# Patient Record
Sex: Male | Born: 1980
Health system: Southern US, Community
[De-identification: ages and names within clinical notes are randomized; demographics above are authoritative.]

## PROBLEM LIST (undated history)

## (undated) DIAGNOSIS — F329 Major depressive disorder, single episode, unspecified: Secondary | ICD-10-CM

## (undated) DIAGNOSIS — T3111 Burns involving 10-19% of body surface with 10-19% third degree burns: Secondary | ICD-10-CM

## (undated) DIAGNOSIS — F32A Depression, unspecified: Secondary | ICD-10-CM

## (undated) DIAGNOSIS — F419 Anxiety disorder, unspecified: Secondary | ICD-10-CM

## (undated) DIAGNOSIS — I10 Essential (primary) hypertension: Secondary | ICD-10-CM

## (undated) HISTORY — PX: OTHER SURGICAL HISTORY: SHX169

---
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
## 2001-01-17 ENCOUNTER — Emergency Department (HOSPITAL_COMMUNITY): Admission: EM | Admit: 2001-01-17 | Discharge: 2001-01-18 | Payer: Self-pay | Admitting: *Deleted

## 2001-06-12 ENCOUNTER — Emergency Department (HOSPITAL_COMMUNITY): Admission: EM | Admit: 2001-06-12 | Discharge: 2001-06-12 | Payer: Self-pay | Admitting: Emergency Medicine

## 2003-03-31 ENCOUNTER — Emergency Department (HOSPITAL_COMMUNITY): Admission: EM | Admit: 2003-03-31 | Discharge: 2003-03-31 | Payer: Self-pay | Admitting: Emergency Medicine

## 2003-04-14 ENCOUNTER — Emergency Department (HOSPITAL_COMMUNITY): Admission: EM | Admit: 2003-04-14 | Discharge: 2003-04-14 | Payer: Self-pay | Admitting: *Deleted

## 2004-02-08 ENCOUNTER — Emergency Department (HOSPITAL_COMMUNITY): Admission: EM | Admit: 2004-02-08 | Discharge: 2004-02-08 | Payer: Self-pay | Admitting: *Deleted

## 2004-05-03 ENCOUNTER — Ambulatory Visit (HOSPITAL_COMMUNITY): Admission: RE | Admit: 2004-05-03 | Discharge: 2004-05-03 | Payer: Self-pay | Admitting: Internal Medicine

## 2004-06-15 ENCOUNTER — Emergency Department (HOSPITAL_COMMUNITY): Admission: EM | Admit: 2004-06-15 | Discharge: 2004-06-15 | Payer: Self-pay | Admitting: *Deleted

## 2004-09-29 ENCOUNTER — Emergency Department (HOSPITAL_COMMUNITY): Admission: EM | Admit: 2004-09-29 | Discharge: 2004-09-29 | Payer: Self-pay | Admitting: Emergency Medicine

## 2004-10-11 ENCOUNTER — Ambulatory Visit: Payer: Self-pay | Admitting: Orthopedic Surgery

## 2004-11-08 ENCOUNTER — Ambulatory Visit: Payer: Self-pay | Admitting: Orthopedic Surgery

## 2005-09-16 ENCOUNTER — Emergency Department (HOSPITAL_COMMUNITY): Admission: EM | Admit: 2005-09-16 | Discharge: 2005-09-16 | Payer: Self-pay | Admitting: Emergency Medicine

## 2006-08-16 ENCOUNTER — Emergency Department (HOSPITAL_COMMUNITY): Admission: EM | Admit: 2006-08-16 | Discharge: 2006-08-16 | Payer: Self-pay | Admitting: Emergency Medicine

## 2008-03-26 ENCOUNTER — Emergency Department (HOSPITAL_COMMUNITY): Admission: EM | Admit: 2008-03-26 | Discharge: 2008-03-26 | Payer: Self-pay | Admitting: Emergency Medicine

## 2009-06-02 ENCOUNTER — Emergency Department (HOSPITAL_COMMUNITY): Admission: EM | Admit: 2009-06-02 | Discharge: 2009-06-02 | Payer: Self-pay | Admitting: Emergency Medicine

## 2009-10-09 ENCOUNTER — Emergency Department (HOSPITAL_COMMUNITY): Admission: EM | Admit: 2009-10-09 | Discharge: 2009-10-09 | Payer: Self-pay | Admitting: Emergency Medicine

## 2009-10-15 ENCOUNTER — Encounter (INDEPENDENT_AMBULATORY_CARE_PROVIDER_SITE_OTHER): Payer: Self-pay | Admitting: *Deleted

## 2010-11-02 NOTE — Letter (Signed)
Summary: Appointment Reminder  [facility]  [address]   [place], [place] [address]   Phone: [phone]  Fax: [phone]       October 15, 2009   Kyle Mays [address] [place], [place]  [address] January 23, 1981    Dear Mr. KOTOWSKI,  We have been unable to reach you by phone to schedule a follow up   appointment that was recommended for you by Dr. Darrick Penna. It is very   important that we reach you to schedule an appointment. We hope that you  allow [place] to participate in your health care needs. Please contact us at  [phone] at your earliest convenience to schedule your appointment.  Sincerely,    [facility] R. Roetta Sessions, M.D.    Kassie Mends, M.D. Lorenza Burton, FNP-BC    Tana Coast, PA-C Phone: [phone]    Fax: [phone]

## 2010-12-19 LAB — BASIC METABOLIC PANEL
CO2: 30 mEq/L (ref 19–32)
Calcium: 9.4 mg/dL (ref 8.4–10.5)
Creatinine, Ser: 0.89 mg/dL (ref 0.4–1.5)
GFR calc non Af Amer: >60 mL/min (ref >60)
Glucose, Bld: 91 mg/dL (ref 70–99)
Potassium: 4.3 mEq/L (ref 3.5–5.1)

## 2010-12-19 LAB — DIFFERENTIAL
Basophils Absolute: 0 10*3/uL (ref 0.0–0.1)
Basophils Relative: 0 % (ref 0–1)
Lymphocytes Relative: 24 % (ref 12–46)
Lymphs Abs: 2.8 10*3/uL (ref 0.7–4.0)
Monocytes Absolute: 0.5 10*3/uL (ref 0.1–1.0)
Neutrophils Relative %: 69 % (ref 43–77)

## 2010-12-19 LAB — CBC
MCHC: 34.1 g/dL (ref 30.0–36.0)
RDW: 13.1 % (ref 11.5–15.5)
WBC: 11.3 10*3/uL — ABNORMAL HIGH (ref 4.0–10.5)

## 2011-05-11 ENCOUNTER — Emergency Department (HOSPITAL_COMMUNITY): Payer: BC Managed Care – PPO

## 2011-05-11 ENCOUNTER — Other Ambulatory Visit: Payer: Self-pay

## 2011-05-11 ENCOUNTER — Emergency Department (HOSPITAL_COMMUNITY)
Admission: EM | Admit: 2011-05-11 | Discharge: 2011-05-11 | Disposition: A | Discharge status: Home / Self Care | Payer: BC Managed Care – PPO | Attending: Emergency Medicine | Admitting: Emergency Medicine

## 2011-05-11 DIAGNOSIS — F17200 Nicotine dependence, unspecified, uncomplicated: Secondary | ICD-10-CM | POA: Insufficient documentation

## 2011-05-11 DIAGNOSIS — F341 Dysthymic disorder: Secondary | ICD-10-CM | POA: Insufficient documentation

## 2011-05-11 DIAGNOSIS — R42 Dizziness and giddiness: Secondary | ICD-10-CM | POA: Insufficient documentation

## 2011-05-11 DIAGNOSIS — E860 Dehydration: Secondary | ICD-10-CM | POA: Insufficient documentation

## 2011-05-11 HISTORY — DX: Major depressive disorder, single episode, unspecified: F32.9

## 2011-05-11 HISTORY — DX: Depression, unspecified: F32.A

## 2011-05-11 HISTORY — DX: Anxiety disorder, unspecified: F41.9

## 2011-05-11 LAB — CBC
HCT: 46 % (ref 39.0–52.0)
MCHC: 33.5 g/dL (ref 30.0–36.0)
WBC: 11.1 10*3/uL — ABNORMAL HIGH (ref 4.0–10.5)

## 2011-05-11 LAB — BASIC METABOLIC PANEL
CO2: 23 mEq/L (ref 19–32)
Calcium: 10.2 mg/dL (ref 8.4–10.5)
Chloride: 100 mEq/L (ref 96–112)
Creatinine, Ser: 0.9 mg/dL (ref 0.50–1.35)
GFR calc Af Amer: >60 mL/min (ref >60)
GFR calc non Af Amer: >60 mL/min (ref >60)
Potassium: 3.8 mEq/L (ref 3.5–5.1)

## 2011-05-11 LAB — URINALYSIS, ROUTINE W REFLEX MICROSCOPIC
Bilirubin Urine: NEGATIVE
Hgb urine dipstick: NEGATIVE
Leukocytes, UA: NEGATIVE
Nitrite: NEGATIVE

## 2011-05-11 LAB — DIFFERENTIAL
Basophils Relative: 0 % (ref 0–1)
Eosinophils Relative: 2 % (ref 0–5)
Neutrophils Relative %: 69 % (ref 43–77)

## 2011-05-11 MED ORDER — SODIUM CHLORIDE 0.9 % IV BOLUS (SEPSIS)
1000.0000 mL | Freq: Once | INTRAVENOUS | Status: AC
  Administered 2011-05-11: 1000 mL via INTRAVENOUS

## 2011-05-11 NOTE — ED Notes (Signed)
C/o dizziness onset 0830 this morning while working; c/o some SOB with dizziness, but denies chest pain or palpitations; describes feeling disoriented and as if he would pass out; states still feels "a little" dizzy, but feels better than this AM; was sent here after being seen at Urgent Care approx 1 hour ago; pt on cardiac monitor-NSR with rate of 80, no ectopy; a&ox4; in no distress; answers questions appropriately

## 2011-05-11 NOTE — ED Notes (Signed)
Pt states he is swimmy headed. SOB

## 2011-05-11 NOTE — ED Provider Notes (Signed)
History     CSN: [id number] Arrival date & time: 05/11/2011  2:24 PM  Chief Complaint  Patient presents with  . 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  The location was excessively warm with no breeze helping to cool him down.  He has had 3 bottles of water plus gatorade today, but has not urinated since early this am.  He skipped breakfast, but ate a regular lunch today.  Wife at bedside states he was out sick 2 days ago,  With generalized weakness nausea and reports of intermittent headaches over the past 5 days.  He reports occasional cough, nonproductive,  Noticing he sees white spots in his field of vision,  Only with forceful cough, at no other time.  He denies headache at present.  No chest pain,  No abdominal pain,  No nausea.  Feels thirsty.  The history is provided by the patient.    Past Medical History  Diagnosis Date  . Depression   . Anxiety     History reviewed. No pertinent past surgical history.  No family history on file.  History  Substance Use Topics  . Smoking status: Current Everyday Smoker    Types: Cigarettes  . Smokeless tobacco: Not on file  . Alcohol Use:       Review of Systems  Constitutional: Positive for fatigue. Negative for fever.  HENT: Negative for congestion, sore throat, neck pain and neck stiffness.   Eyes: Negative.   Respiratory: Positive for shortness of breath. Negative for cough, chest tightness, wheezing and stridor.   Cardiovascular: Negative for chest pain, palpitations and leg swelling.  Gastrointestinal: Negative for nausea and abdominal pain.  Genitourinary: Positive for decreased urine volume.  Musculoskeletal: Negative for myalgias, joint swelling and arthralgias.  Skin: Negative.  Negative for rash and wound.  Neurological: Positive for light-headedness. Negative for weakness, numbness and headaches.  Hematological: Negative.     Psychiatric/Behavioral: Negative.     Physical Exam  BP 125/82  Pulse 98  Temp(Src) 97.4 F (36.3 C) (Oral)  Resp 20  Ht 5\' 6"  (1.676 m)  Wt 250 lb (113.399 kg)  BMI 40.35 kg/m2  SpO2 97%  Physical Exam  Vitals reviewed. Constitutional: He is oriented to person, place, and time. He appears well-developed and well-nourished.  HENT:  Head: Normocephalic and atraumatic.  Mouth/Throat: Mucous membranes are dry.  Eyes: Conjunctivae are normal.  Neck: Normal range of motion.  Cardiovascular: Normal rate, regular rhythm, normal heart sounds and intact distal pulses.   Pulmonary/Chest: Effort normal and breath sounds normal. He has no wheezes.  Abdominal: Soft. Bowel sounds are normal. There is no tenderness.  Musculoskeletal: Normal range of motion.  Neurological: He is alert and oriented to person, place, and time.  Skin: Skin is warm and dry.  Psychiatric: He has a normal mood and affect.    ED Course  Procedures  Patient refused the cxr which was ordered.  He was given PO fluids and NS IV 1 liter bolus.  He is perc negative with no risk factors for PE.   MDM    Date: 05/11/2011  Rate: 82  Rhythm: normal sinus rhythm  QRS Axis: normal  Intervals: normal  ST/T Wave abnormalities: normal  Conduction Disutrbances:none  Narrative Interpretation:   Old EKG Reviewed: none available    Medical screening examination/treatment/procedure(s) were performed by non-physician practitioner and as supervising physician I was immediately available for consultation/collaboration.  Candis Musa, PA 05/11/11 1816  Jacklynn Dehaas K Aneesh Faller-Rasch, MD 05/11/11 2207

## 2011-05-11 NOTE — ED Notes (Signed)
Given ice water for po fluid challenge. A&ox4; in no distress.

## 2011-05-11 NOTE — ED Notes (Signed)
Pt left prior to receiving discharge instructions.

## 2012-02-13 ENCOUNTER — Encounter (HOSPITAL_COMMUNITY): Payer: Self-pay | Admitting: *Deleted

## 2012-02-13 ENCOUNTER — Emergency Department (HOSPITAL_COMMUNITY)
Admission: EM | Admit: 2012-02-13 | Discharge: 2012-02-13 | Disposition: A | Discharge status: Home / Self Care | Payer: BC Managed Care – PPO | Attending: Emergency Medicine | Admitting: Emergency Medicine

## 2012-02-13 DIAGNOSIS — IMO0002 Reserved for concepts with insufficient information to code with codable children: Secondary | ICD-10-CM | POA: Insufficient documentation

## 2012-02-13 DIAGNOSIS — F411 Generalized anxiety disorder: Secondary | ICD-10-CM | POA: Insufficient documentation

## 2012-02-13 DIAGNOSIS — X58XXXA Exposure to other specified factors, initial encounter: Secondary | ICD-10-CM | POA: Insufficient documentation

## 2012-02-13 DIAGNOSIS — F17200 Nicotine dependence, unspecified, uncomplicated: Secondary | ICD-10-CM | POA: Insufficient documentation

## 2012-02-13 DIAGNOSIS — F3289 Other specified depressive episodes: Secondary | ICD-10-CM | POA: Insufficient documentation

## 2012-02-13 DIAGNOSIS — F329 Major depressive disorder, single episode, unspecified: Secondary | ICD-10-CM | POA: Insufficient documentation

## 2012-02-13 DIAGNOSIS — T148XXA Other injury of unspecified body region, initial encounter: Secondary | ICD-10-CM

## 2012-02-13 NOTE — ED Notes (Signed)
Pt. Discharged to home, pt. Alert and oriented, ambulatory, gait steady, NAD noted 

## 2012-02-13 NOTE — ED Notes (Signed)
Blister between  Rt  great and 2nd toes.  Had itched initially, now has blister and pain

## 2012-02-13 NOTE — ED Notes (Signed)
Received pt. From Triage, pt. Alert and oriented, ambulated, NAD noted,

## 2012-02-13 NOTE — Discharge Instructions (Signed)
Blisters Blisters are fluid-filled sacs that form within the skin. Common causes of blistering are friction, burns, and exposure to irritating chemicals. The fluid in the blister protects the underlying damaged skin. Most of the time it is not recommended that you open blisters. When a blister is opened, there is an increased chance for infection. Usually, a blister will open on its own. They then dry up and peel off within 10 days. If the blister is tense and uncomfortable (painful) the fluid may be drained. If it is drained the roof of the blister should be left intact. The draining should only be done by a medical professional under aseptic conditions. Poorly fitting shoes and boots can cause blisters by being too tight or too loose. Wearing extra socks or using tape, bandages, or pads over the blister-prone area helps prevent the problem by reducing friction. Blisters heal more slowly if you have diabetes or if you have problems with your circulation. You need to be careful about medical follow-up to prevent infection. HOME CARE INSTRUCTIONS  Protect areas where blisters have formed until the skin is healed. Use a special bandage with a hole cut in the middle around the blister. This reduces pressure and friction. When the blister breaks, trim off the loose skin and keep the area clean by washing it with soap daily. Soaking the blister or broken-open blister with diluted vinegar twice daily for 15 minutes will dry it up and speed the healing. Use 3 tablespoons of white vinegar per quart of water (45 mL white vinegar per liter of water). An antibiotic ointment and a bandage can be used to cover the area after soaking.  SEEK MEDICAL CARE IF:   You develop increased redness, pain, swelling, or drainage in the blistered area.   You develop a pus-like discharge from the blistered area, chills, or a fever.  MAKE SURE YOU:   Understand these instructions.   Will watch your condition.   Will get help  right away if you are not doing well or get worse.  Document Released: 10/27/2004 Document Revised: 09/08/2011 Document Reviewed: 09/24/2008 Exit[employer] Patient Information 2012 [employer]. 

## 2012-02-13 NOTE — ED Provider Notes (Signed)
History     CSN: [id number]  Arrival date & time 02/13/12  Kyle Mays   First MD Initiated Contact with Patient 02/13/12 2109      Chief Complaint  Patient presents with  . Foot Pain     Patient is a 31 y.o. male presenting with lower extremity pain. The history is provided by the patient.  Foot Pain This is a new problem. The current episode started 2 days ago. The problem occurs hourly. The problem has not changed since onset.Pertinent negatives include no abdominal pain and no shortness of breath. The symptoms are aggravated by standing. The symptoms are relieved by rest. He has tried rest for the symptoms. The treatment provided no relief.   Pt reports blister between right great and second toe.  No trauma.  No injury.  He is ambulatory.  Reports mild itching  Past Medical History  Diagnosis Date  . Depression   . Anxiety     History reviewed. No pertinent past surgical history.  History reviewed. No pertinent family history.  History  Substance Use Topics  . Smoking status: Current Everyday Smoker    Types: Cigarettes  . Smokeless tobacco: Not on file  . Alcohol Use: No      Review of Systems  Respiratory: Negative for shortness of breath.   Gastrointestinal: Negative for abdominal pain.    Allergies  Review of patient's allergies indicates no known allergies.  Home Medications   Current Outpatient Rx  Name Route Sig Dispense Refill  . ALPRAZOLAM 0.5 MG PO TABS Oral Take 0.5 mg by mouth daily as needed. Anxiety     . CLOBETASOL PROPIONATE 0.05 % EX CREA Topical Apply 1 application topically 2 (two) times daily as needed. Dry skin     . DULOXETINE HCL 60 MG PO CPEP Oral Take 60 mg by mouth daily.        BP 144/100  Pulse 94  Temp(Src) 98 F (36.7 C) (Oral)  Resp 16  Ht 5\' 6"  (1.676 m)  Wt 250 lb (113.399 kg)  BMI 40.35 kg/m2  SpO2 98%  Physical Exam CONSTITUTIONAL: Well developed/well nourished HEAD AND FACE: Normocephalic/atraumatic EYES:  EOMI/PERRL ENMT: Mucous membranes moist NECK: supple no meningeal signs CV: S1/S2 noted, no murmurs/rubs/gallops noted LUNGS: Lungs are clear to auscultation bilaterally, no apparent distress ABDOMEN: soft, nontender, no rebound or guarding NEURO: Pt is awake/alert, moves all extremitiesx4 EXTREMITIES: pulses normal, full ROM Healed scar to medial aspect of right foot He has blister noted between great and second toe on right foot.  It is tender to palpation, no crepitance/streaking/eryhthema noted.  No edema noted to foot. No vesicles noted to foot SKIN: warm, color normal PSYCH: no abnormalities of mood noted ED Course  Procedures     1. Blister       MDM  Nursing notes reviewed and considered in documentation         Joya Gaskins, MD 02/13/12 2150

## 2012-04-24 ENCOUNTER — Emergency Department (HOSPITAL_COMMUNITY)
Admission: EM | Admit: 2012-04-24 | Discharge: 2012-04-24 | Disposition: A | Discharge status: Home / Self Care | Payer: BC Managed Care – PPO | Attending: Emergency Medicine | Admitting: Emergency Medicine

## 2012-04-24 ENCOUNTER — Encounter (HOSPITAL_COMMUNITY): Payer: Self-pay

## 2012-04-24 ENCOUNTER — Emergency Department (HOSPITAL_COMMUNITY): Payer: BC Managed Care – PPO

## 2012-04-24 DIAGNOSIS — F17200 Nicotine dependence, unspecified, uncomplicated: Secondary | ICD-10-CM | POA: Insufficient documentation

## 2012-04-24 DIAGNOSIS — Z79899 Other long term (current) drug therapy: Secondary | ICD-10-CM | POA: Insufficient documentation

## 2012-04-24 DIAGNOSIS — F3289 Other specified depressive episodes: Secondary | ICD-10-CM | POA: Insufficient documentation

## 2012-04-24 DIAGNOSIS — R079 Chest pain, unspecified: Secondary | ICD-10-CM | POA: Insufficient documentation

## 2012-04-24 DIAGNOSIS — F329 Major depressive disorder, single episode, unspecified: Secondary | ICD-10-CM | POA: Insufficient documentation

## 2012-04-24 DIAGNOSIS — F411 Generalized anxiety disorder: Secondary | ICD-10-CM | POA: Insufficient documentation

## 2012-04-24 DIAGNOSIS — R0789 Other chest pain: Secondary | ICD-10-CM

## 2012-04-24 LAB — CBC
MCH: 29.4 pg (ref 26.0–34.0)
Platelets: 171 10*3/uL (ref 150–400)
RBC: 5.37 MIL/uL (ref 4.22–5.81)
WBC: 11.8 10*3/uL — ABNORMAL HIGH (ref 4.0–10.5)

## 2012-04-24 LAB — BASIC METABOLIC PANEL
CO2: 25 mEq/L (ref 19–32)
Calcium: 9.3 mg/dL (ref 8.4–10.5)
Chloride: 103 mEq/L (ref 96–112)
Sodium: 138 mEq/L (ref 135–145)

## 2012-04-24 LAB — PROTIME-INR
INR: 0.98 (ref 0.00–1.49)
Prothrombin Time: 13.2 seconds (ref 11.6–15.2)

## 2012-04-24 LAB — POCT I-STAT TROPONIN I: Troponin i, poc: 0 ng/mL (ref 0.00–0.08)

## 2012-04-24 MED ORDER — ASPIRIN 81 MG PO CHEW
324.0000 mg | CHEWABLE_TABLET | Freq: Once | ORAL | Status: AC
  Administered 2012-04-24: 324 mg via ORAL
  Filled 2012-04-24: qty 4

## 2012-04-24 MED ORDER — OXYCODONE-ACETAMINOPHEN 5-325 MG PO TABS
1.0000 | ORAL_TABLET | Freq: Once | ORAL | Status: AC
  Administered 2012-04-24: 1 via ORAL
  Filled 2012-04-24: qty 1

## 2012-04-24 MED ORDER — NITROGLYCERIN 0.4 MG SL SUBL
0.4000 mg | SUBLINGUAL_TABLET | SUBLINGUAL | Status: DC | PRN
  Administered 2012-04-24 (×2): 0.4 mg via SUBLINGUAL
  Filled 2012-04-24: qty 75

## 2012-04-24 MED ORDER — CYCLOBENZAPRINE HCL 10 MG PO TABS: 10.0000 mg | ORAL_TABLET | Freq: Two times a day (BID) | ORAL | Status: AC | PRN

## 2012-04-24 MED ORDER — DIAZEPAM 5 MG PO TABS
10.0000 mg | ORAL_TABLET | Freq: Once | ORAL | Status: AC
  Administered 2012-04-24: 10 mg via ORAL
  Filled 2012-04-24: qty 2

## 2012-04-24 MED ORDER — IBUPROFEN 800 MG PO TABS: 800.0000 mg | ORAL_TABLET | Freq: Three times a day (TID) | ORAL | Status: AC

## 2012-04-24 MED ORDER — OXYCODONE-ACETAMINOPHEN 5-325 MG PO TABS: 1.0000 | ORAL_TABLET | ORAL | Status: AC | PRN
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
## 2012-04-24 NOTE — ED Provider Notes (Signed)
Patient CP improved until he begins activity. 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  Cardiac: Normal rate rhythm. Pulmonary: CTA. Abdomen: bowel sounds in all quadrants no tenderness. No pedal edema.  Serial Troponin: 1 Negative 2. Negative 3. Negative CXR: unremarkable. EKG: Normal  2:45 pm patient began to complain of left sided CP again 8/10, reproducible, without transmission, worse with movement. Repeat EKG performed.   Date: 04/24/2012  Rate: 63  Rhythm: normal sinus rhythm  QRS Axis: normal  Intervals: normal  ST/T Wave abnormalities: normal  Conduction Disutrbances:none  Narrative Interpretation: Normal ECG  Old EKG Reviewed: unchanged  This EKG reviewed with Dr. Lorenso Courier who believes the pain to be musculoskeletal in origin. Plans to discharge on Flexeril, Percocet, and Ibuprofen. Patient give return precautions verbally and in discharge summary.  Pixie Casino, PA-C 04/24/12 1504

## 2012-04-24 NOTE — ED Notes (Signed)
Pt was going into work this morning and began having chest pain, sts took acid reflux medicaiton an no relief.

## 2012-04-24 NOTE — ED Notes (Signed)
Pt reports sharp left sided chest pain on driving to work this AM at approx 0630. Pt reports pain was worse with deep inspiration and movement. Denies nausea, diaphoresis or radiation of pain.

## 2012-10-08 ENCOUNTER — Encounter (HOSPITAL_COMMUNITY): Payer: Self-pay | Admitting: Emergency Medicine

## 2012-10-08 ENCOUNTER — Emergency Department (HOSPITAL_COMMUNITY)
Admission: EM | Admit: 2012-10-08 | Discharge: 2012-10-08 | Disposition: A | Discharge status: Home / Self Care | Payer: BC Managed Care – PPO | Attending: Emergency Medicine | Admitting: Emergency Medicine

## 2012-10-08 ENCOUNTER — Emergency Department (HOSPITAL_COMMUNITY): Payer: BC Managed Care – PPO

## 2012-10-08 DIAGNOSIS — Z79899 Other long term (current) drug therapy: Secondary | ICD-10-CM | POA: Insufficient documentation

## 2012-10-08 DIAGNOSIS — F329 Major depressive disorder, single episode, unspecified: Secondary | ICD-10-CM | POA: Insufficient documentation

## 2012-10-08 DIAGNOSIS — N451 Epididymitis: Secondary | ICD-10-CM

## 2012-10-08 DIAGNOSIS — N453 Epididymo-orchitis: Secondary | ICD-10-CM | POA: Insufficient documentation

## 2012-10-08 DIAGNOSIS — F17200 Nicotine dependence, unspecified, uncomplicated: Secondary | ICD-10-CM | POA: Insufficient documentation

## 2012-10-08 DIAGNOSIS — R1110 Vomiting, unspecified: Secondary | ICD-10-CM | POA: Insufficient documentation

## 2012-10-08 DIAGNOSIS — F411 Generalized anxiety disorder: Secondary | ICD-10-CM | POA: Insufficient documentation

## 2012-10-08 DIAGNOSIS — F3289 Other specified depressive episodes: Secondary | ICD-10-CM | POA: Insufficient documentation

## 2012-10-08 LAB — URINALYSIS, ROUTINE W REFLEX MICROSCOPIC
Leukocytes, UA: NEGATIVE
Nitrite: NEGATIVE
Protein, ur: NEGATIVE mg/dL
Urobilinogen, UA: 0.2 mg/dL (ref 0.0–1.0)

## 2012-10-08 MED ORDER — LIDOCAINE HCL (PF) 1 % IJ SOLN
INTRAMUSCULAR | Status: AC
  Administered 2012-10-08: 2.1 mL
  Filled 2012-10-08: qty 5

## 2012-10-08 MED ORDER — OXYCODONE-ACETAMINOPHEN 5-325 MG PO TABS
2.0000 | ORAL_TABLET | Freq: Once | ORAL | Status: AC
  Administered 2012-10-08: 2 via ORAL
  Filled 2012-10-08: qty 2

## 2012-10-08 MED ORDER — DOXYCYCLINE HYCLATE 100 MG PO TABS
100.0000 mg | ORAL_TABLET | Freq: Once | ORAL | Status: AC
  Administered 2012-10-08: 100 mg via ORAL
  Filled 2012-10-08: qty 1

## 2012-10-08 MED ORDER — HYDROCODONE-ACETAMINOPHEN 5-325 MG PO TABS: 1.0000 | ORAL_TABLET | ORAL | Status: DC | PRN

## 2012-10-08 MED ORDER — CEFTRIAXONE SODIUM 1 G IJ SOLR
1.0000 g | Freq: Once | INTRAMUSCULAR | Status: AC
  Administered 2012-10-08: 1 g via INTRAMUSCULAR
  Filled 2012-10-08: qty 10

## 2012-10-08 MED ORDER — DOXYCYCLINE HYCLATE 100 MG PO CAPS: 100.0000 mg | ORAL_CAPSULE | Freq: Two times a day (BID) | ORAL | Status: DC

## 2012-10-08 NOTE — ED Provider Notes (Signed)
History     CSN: [id number]  Arrival date & time 10/08/12  2952   First MD Initiated Contact with Patient 10/08/12 0901      Chief Complaint  Patient presents with  . Testicle Pain    (Consider location/radiation/quality/duration/timing/severity/associated sxs/prior treatment) HPI Comments: 32 year old male presents emergency department complaining of sudden onset right-sided testicular pain since Saturday. Describes pain as sharp, non radiating, rated 10 out of 10. Nothing in particular makes the pain worse, it is present when he sits or stands. He tried taking ibuprofen with only mild relief. He is not sure whether or not the testicle is swollen because he cannot touch it. That he was getting "build up" so had intercourse with his wife in order to "empty out his sperm", however this made the pain worse. Denies penile pain or discharge. Denies dysuria, hematuria or any urinary symptoms. No fever or chills. The pain was so severe yesterday that he did vomit once.  The history is provided by the patient.    Past Medical History  Diagnosis Date  . Depression   . Anxiety     History reviewed. No pertinent past surgical history.  History reviewed. No pertinent family history.  History  Substance Use Topics  . Smoking status: Current Every Day Smoker    Types: Cigarettes  . Smokeless tobacco: Not on file  . Alcohol Use: No      Review of Systems  Constitutional: Negative for fever and chills.  HENT: Negative.   Respiratory: Negative for shortness of breath.   Cardiovascular: Negative for chest pain.  Gastrointestinal: Positive for vomiting.  Genitourinary: Positive for testicular pain. Negative for dysuria, hematuria, flank pain, discharge, difficulty urinating and penile pain.  Musculoskeletal: Negative for back pain.  Skin: Negative for color change.  Neurological: Negative for numbness.  Psychiatric/Behavioral: The patient is not nervous/anxious.     Allergies    Review of patient's allergies indicates no known allergies.  Home Medications   Current Outpatient Rx  Name  Route  Sig  Dispense  Refill  . ALPRAZOLAM 0.5 MG PO TABS   Oral   Take 0.5 mg by mouth at bedtime as needed. For sleep         . DULOXETINE HCL 60 MG PO CPEP   Oral   Take 60 mg by mouth daily.         . IBUPROFEN 200 MG PO TABS   Oral   Take 200-400 mg by mouth daily as needed. For pain           BP 146/79  Pulse 80  Temp 98.1 F (36.7 C) (Oral)  Resp 22  SpO2 96%  Physical Exam  Nursing note and vitals reviewed. Constitutional: He is oriented to person, place, and time. He appears well-developed. No distress.       Overweight, pacing.  HENT:  Head: Normocephalic and atraumatic.  Eyes: Conjunctivae normal are normal.  Neck: Normal range of motion. Neck supple.  Cardiovascular: Normal rate, regular rhythm and normal heart sounds.   Pulmonary/Chest: Effort normal and breath sounds normal.  Abdominal: Soft. Bowel sounds are normal. There is no tenderness.  Genitourinary: Penis normal. Right testis shows swelling and tenderness (to very light touch). Left testis shows no mass, no swelling and no tenderness. Circumcised. No penile tenderness. No discharge found.  Musculoskeletal: Normal range of motion. He exhibits no edema.  Neurological: He is alert and oriented to person, place, and time.  Skin: Skin is warm and dry.  No erythema.  Psychiatric: His behavior is normal. His mood appears anxious.    ED Course  Procedures (including critical care time)   Labs Reviewed  URINALYSIS, ROUTINE W REFLEX MICROSCOPIC   US Scrotum  10/08/2012  *RADIOLOGY REPORT*  Clinical Data:  Testicular pain.  SCROTAL ULTRASOUND DOPPLER ULTRASOUND OF THE TESTICLES  Technique: Complete ultrasound examination of the testicles, epididymis, and other scrotal structures was performed.  Color and spectral Doppler ultrasound were also utilized to evaluate blood flow to the testicles.   Comparison:  None  Findings:  Right testis:  3.9 x 2.2 x 3.1 cm.  Normal echotexture.  No focal abnormality.  Normal arterial and venous blood flow.  Left testis:  3.6 x 2.4 x 2.9 cm.  Normal echotexture.  No focal abnormality.  Normal arterial and venous blood flow.  Right epididymis:  Small cysts in the epididymal head, the largest 12 mm. Epididymal body and tail are prominent and heterogeneous with increased blood flow suggesting epididymitis.  Left epididymis:  2 mm cyst in the epididymal head.  Hydrocele:  Absent  Varicocele:  Absent  Pulsed Doppler interrogation of both testes demonstrates low resistance flow bilaterally.  IMPRESSION: Prominent right epididymal body and tail with increased vascularity compatible with epididymitis.  Bilateral epididymal head cysts.  Testes unremarkable.   Original Report Authenticated By: Charlett Nose, M.D.    [place] Art/ven Flow Abd Pelv Doppler  10/08/2012  *RADIOLOGY REPORT*  Clinical Data:  Testicular pain.  SCROTAL ULTRASOUND DOPPLER ULTRASOUND OF THE TESTICLES  Technique: Complete ultrasound examination of the testicles, epididymis, and other scrotal structures was performed.  Color and spectral Doppler ultrasound were also utilized to evaluate blood flow to the testicles.  Comparison:  None  Findings:  Right testis:  3.9 x 2.2 x 3.1 cm.  Normal echotexture.  No focal abnormality.  Normal arterial and venous blood flow.  Left testis:  3.6 x 2.4 x 2.9 cm.  Normal echotexture.  No focal abnormality.  Normal arterial and venous blood flow.  Right epididymis:  Small cysts in the epididymal head, the largest 12 mm. Epididymal body and tail are prominent and heterogeneous with increased blood flow suggesting epididymitis.  Left epididymis:  2 mm cyst in the epididymal head.  Hydrocele:  Absent  Varicocele:  Absent  Pulsed Doppler interrogation of both testes demonstrates low resistance flow bilaterally.  IMPRESSION: Prominent right epididymal body and tail with increased  vascularity compatible with epididymitis.  Bilateral epididymal head cysts.  Testes unremarkable.   Original Report Authenticated By: Charlett Nose, M.D.      1. Epididymitis       MDM  32 year old male with epididymitis. 1 g IM Rocephin given. Prescribed doxycycline for 7 days. No evidence of torsion on ultrasound. Vicodin #6 given for severe pain. Return precautions discussed. Patient states understanding of plan and is agreeable.        Trevor Mace, PA-C 10/08/12 1117

## 2012-10-08 NOTE — ED Notes (Signed)
Pt c/o right sided testicle pain x 3 days; pt sts hx of same and was not seen for

## 2012-10-08 NOTE — ED Provider Notes (Signed)
Medical screening examination/treatment/procedure(s) were performed by non-physician practitioner and as supervising physician I was immediately available for consultation/collaboration.   Flint Melter, MD 10/08/12 [phone]

## 2012-10-21 ENCOUNTER — Emergency Department (HOSPITAL_COMMUNITY)
Admission: EM | Admit: 2012-10-21 | Discharge: 2012-10-21 | Disposition: A | Discharge status: Home / Self Care | Payer: BC Managed Care – PPO | Attending: Emergency Medicine | Admitting: Emergency Medicine

## 2012-10-21 ENCOUNTER — Encounter (HOSPITAL_COMMUNITY): Payer: Self-pay | Admitting: *Deleted

## 2012-10-21 DIAGNOSIS — Z87828 Personal history of other (healed) physical injury and trauma: Secondary | ICD-10-CM | POA: Insufficient documentation

## 2012-10-21 DIAGNOSIS — M2540 Effusion, unspecified joint: Secondary | ICD-10-CM | POA: Insufficient documentation

## 2012-10-21 DIAGNOSIS — Z79899 Other long term (current) drug therapy: Secondary | ICD-10-CM | POA: Insufficient documentation

## 2012-10-21 DIAGNOSIS — M2550 Pain in unspecified joint: Secondary | ICD-10-CM | POA: Insufficient documentation

## 2012-10-21 DIAGNOSIS — F411 Generalized anxiety disorder: Secondary | ICD-10-CM | POA: Insufficient documentation

## 2012-10-21 DIAGNOSIS — F17200 Nicotine dependence, unspecified, uncomplicated: Secondary | ICD-10-CM | POA: Insufficient documentation

## 2012-10-21 DIAGNOSIS — F329 Major depressive disorder, single episode, unspecified: Secondary | ICD-10-CM | POA: Insufficient documentation

## 2012-10-21 DIAGNOSIS — F3289 Other specified depressive episodes: Secondary | ICD-10-CM | POA: Insufficient documentation

## 2012-10-21 DIAGNOSIS — G8929 Other chronic pain: Secondary | ICD-10-CM | POA: Insufficient documentation

## 2012-10-21 HISTORY — DX: Burns involving 10-19% of body surface with 10-19% third degree burns: T31.11

## 2012-10-21 MED ORDER — CYCLOBENZAPRINE HCL 10 MG PO TABS: 10.0000 mg | ORAL_TABLET | Freq: Three times a day (TID) | ORAL | Status: DC | PRN

## 2012-10-21 MED ORDER — HYDROMORPHONE HCL PF 1 MG/ML IJ SOLN
1.0000 mg | Freq: Once | INTRAMUSCULAR | Status: AC
  Administered 2012-10-21: 1 mg via INTRAMUSCULAR
  Filled 2012-10-21: qty 1

## 2012-10-21 MED ORDER — PREDNISONE 20 MG PO TABS: ORAL_TABLET | ORAL | Status: DC

## 2012-10-21 MED ORDER — OXYCODONE-ACETAMINOPHEN 5-325 MG PO TABS: 1.0000 | ORAL_TABLET | ORAL | Status: AC | PRN

## 2012-10-21 MED ORDER — METHOCARBAMOL 500 MG PO TABS
1000.0000 mg | ORAL_TABLET | Freq: Once | ORAL | Status: AC
  Administered 2012-10-21: 1000 mg via ORAL
  Filled 2012-10-21: qty 2

## 2012-10-21 NOTE — Discharge Instructions (Signed)
Arthralgia Your caregiver has diagnosed you as suffering from an arthralgia. Arthralgia means there is pain in a joint. This can come from many reasons including:  Bruising the joint which causes soreness (inflammation) in the joint.  Wear and tear on the joints which occur as we grow older (osteoarthritis).  Overusing the joint.  Various forms of arthritis.  Infections of the joint. Regardless of the cause of pain in your joint, most of these different pains respond to anti-inflammatory drugs and rest. The exception to this is when a joint is infected, and these cases are treated with antibiotics, if it is a bacterial infection. HOME CARE INSTRUCTIONS   Rest the injured area for as long as directed by your caregiver. Then slowly start using the joint as directed by your caregiver and as the pain allows. Crutches as directed may be useful if the ankles, knees or hips are involved. If the knee was splinted or casted, continue use and care as directed. If an stretchy or elastic wrapping bandage has been applied today, it should be removed and re-applied every 3 to 4 hours. It should not be applied tightly, but firmly enough to keep swelling down. Watch toes and feet for swelling, bluish discoloration, coldness, numbness or excessive pain. If any of these problems (symptoms) occur, remove the ace bandage and re-apply more loosely. If these symptoms persist, contact your caregiver or return to this location.  For the first 24 hours, keep the injured extremity elevated on pillows while lying down.  Apply ice for 15 to 20 minutes to the sore joint every couple hours while awake for the first half day. Then 3 to 4 times per day for the first 48 hours. Put the ice in a plastic bag and place a towel between the bag of ice and your skin.  Wear any splinting, casting, elastic bandage applications, or slings as instructed.  Only take over-the-counter or prescription medicines for pain, discomfort, or  fever as directed by your caregiver. Do not use aspirin immediately after the injury unless instructed by your physician. Aspirin can cause increased bleeding and bruising of the tissues.  If you were given crutches, continue to use them as instructed and do not resume weight bearing on the sore joint until instructed. Persistent pain and inability to use the sore joint as directed for more than 2 to 3 days are warning signs indicating that you should see a caregiver for a follow-up visit as soon as possible. Initially, a hairline fracture (break in bone) may not be evident on X-rays. Persistent pain and swelling indicate that further evaluation, non-weight bearing or use of the joint (use of crutches or slings as instructed), or further X-rays are indicated. X-rays may sometimes not show a small fracture until a week or 10 days later. Make a follow-up appointment with your own caregiver or one to whom we have referred you. A radiologist (specialist in reading X-rays) may read your X-rays. Make sure you know how you are to obtain your X-ray results. Do not assume everything is normal if you do not hear from us. SEEK MEDICAL CARE IF: Bruising, swelling, or pain increases. SEEK IMMEDIATE MEDICAL CARE IF:   Your fingers or toes are numb or blue.  The pain is not responding to medications and continues to stay the same or get worse.  The pain in your joint becomes severe.  You develop a fever over 102 F (38.9 C).  It becomes impossible to move or use the joint.   MAKE SURE YOU:   Understand these instructions.  Will watch your condition.  Will get help right away if you are not doing well or get worse. Document Released: 09/19/2005 Document Revised: 12/12/2011 Document Reviewed: 05/07/2008 [employer] Patient Information 2013 [employer].  

## 2012-10-21 NOTE — ED Notes (Signed)
Patient with no complaints at this time. Respirations even and unlabored. Skin warm/dry. Discharge instructions reviewed with patient at this time. Patient given opportunity to voice concerns/ask questions. Patient discharged at this time and left Emergency Department with steady gait.   
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
## 2012-10-24 NOTE — ED Provider Notes (Signed)
Medical screening examination/treatment/procedure(s) were performed by non-physician practitioner and as supervising physician I was immediately available for consultation/collaboration.   Joya Gaskins, MD 10/24/12 1755

## 2013-05-13 ENCOUNTER — Emergency Department: Payer: Self-pay | Admitting: Emergency Medicine

## 2013-05-13 LAB — COMPREHENSIVE METABOLIC PANEL
Alkaline Phosphatase: 73 U/L (ref 50–136)
Anion Gap: 5 — ABNORMAL LOW (ref 7–16)
BUN: 8 mg/dL (ref 7–18)
Bilirubin,Total: 0.3 mg/dL (ref 0.2–1.0)
Creatinine: 0.99 mg/dL (ref 0.60–1.30)
EGFR (African American): >60
SGOT(AST): 30 U/L (ref 15–37)
Sodium: 138 mmol/L (ref 136–145)
Total Protein: 8.1 g/dL (ref 6.4–8.2)

## 2013-05-13 LAB — CBC
HCT: 43 % (ref 40.0–52.0)
HGB: 14.9 g/dL (ref 13.0–18.0)
MCH: 29.1 pg (ref 26.0–34.0)
MCV: 84 fL (ref 80–100)
RBC: 5.14 10*6/uL (ref 4.40–5.90)
WBC: 10.3 10*3/uL (ref 3.8–10.6)

## 2013-05-13 LAB — CK TOTAL AND CKMB (NOT AT ARMC): CK, Total: 89 U/L (ref 35–232)

## 2013-05-13 LAB — TROPONIN I: Troponin-I: <0.02 ng/mL

## 2013-06-21 ENCOUNTER — Emergency Department (HOSPITAL_COMMUNITY): Payer: BC Managed Care – PPO

## 2013-06-21 ENCOUNTER — Encounter (HOSPITAL_COMMUNITY): Payer: Self-pay | Admitting: *Deleted

## 2013-06-21 ENCOUNTER — Emergency Department (HOSPITAL_COMMUNITY)
Admission: EM | Admit: 2013-06-21 | Discharge: 2013-06-22 | Disposition: A | Discharge status: Home / Self Care | Payer: BC Managed Care – PPO | Attending: Emergency Medicine | Admitting: Emergency Medicine

## 2013-06-21 DIAGNOSIS — Y9389 Activity, other specified: Secondary | ICD-10-CM | POA: Insufficient documentation

## 2013-06-21 DIAGNOSIS — S8010XA Contusion of unspecified lower leg, initial encounter: Secondary | ICD-10-CM | POA: Insufficient documentation

## 2013-06-21 DIAGNOSIS — Z87828 Personal history of other (healed) physical injury and trauma: Secondary | ICD-10-CM | POA: Insufficient documentation

## 2013-06-21 DIAGNOSIS — W108XXA Fall (on) (from) other stairs and steps, initial encounter: Secondary | ICD-10-CM | POA: Insufficient documentation

## 2013-06-21 DIAGNOSIS — F3289 Other specified depressive episodes: Secondary | ICD-10-CM | POA: Insufficient documentation

## 2013-06-21 DIAGNOSIS — Y9289 Other specified places as the place of occurrence of the external cause: Secondary | ICD-10-CM | POA: Insufficient documentation

## 2013-06-21 DIAGNOSIS — Z79899 Other long term (current) drug therapy: Secondary | ICD-10-CM | POA: Insufficient documentation

## 2013-06-21 DIAGNOSIS — F329 Major depressive disorder, single episode, unspecified: Secondary | ICD-10-CM | POA: Insufficient documentation

## 2013-06-21 DIAGNOSIS — F17200 Nicotine dependence, unspecified, uncomplicated: Secondary | ICD-10-CM | POA: Insufficient documentation

## 2013-06-21 DIAGNOSIS — F411 Generalized anxiety disorder: Secondary | ICD-10-CM | POA: Insufficient documentation

## 2013-06-21 DIAGNOSIS — S8011XA Contusion of right lower leg, initial encounter: Secondary | ICD-10-CM

## 2013-06-21 DIAGNOSIS — IMO0002 Reserved for concepts with insufficient information to code with codable children: Secondary | ICD-10-CM | POA: Insufficient documentation

## 2013-06-21 MED ORDER — OXYCODONE-ACETAMINOPHEN 5-325 MG PO TABS: 1.0000 | ORAL_TABLET | ORAL | Status: DC | PRN

## 2013-06-21 MED ORDER — OXYCODONE-ACETAMINOPHEN 5-325 MG PO TABS
2.0000 | ORAL_TABLET | Freq: Once | ORAL | Status: AC
  Administered 2013-06-22: 2 via ORAL
  Filled 2013-06-21: qty 2
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
## 2013-06-21 NOTE — ED Notes (Signed)
Pain rt leg after fall today , missed a step getting out of a bucket.  Large contusion to   Rt lower leg

## 2013-06-21 NOTE — ED Notes (Signed)
MD at bedside. 

## 2013-11-12 ENCOUNTER — Encounter (HOSPITAL_COMMUNITY): Payer: Self-pay | Admitting: Emergency Medicine

## 2013-11-12 ENCOUNTER — Emergency Department (HOSPITAL_COMMUNITY)
Admission: EM | Admit: 2013-11-12 | Discharge: 2013-11-12 | Disposition: A | Discharge status: Home / Self Care | Payer: BC Managed Care – PPO | Attending: Emergency Medicine | Admitting: Emergency Medicine

## 2013-11-12 DIAGNOSIS — Z23 Encounter for immunization: Secondary | ICD-10-CM | POA: Insufficient documentation

## 2013-11-12 DIAGNOSIS — L0390 Cellulitis, unspecified: Principal | ICD-10-CM

## 2013-11-12 DIAGNOSIS — F17200 Nicotine dependence, unspecified, uncomplicated: Secondary | ICD-10-CM | POA: Insufficient documentation

## 2013-11-12 DIAGNOSIS — L089 Local infection of the skin and subcutaneous tissue, unspecified: Secondary | ICD-10-CM

## 2013-11-12 DIAGNOSIS — L0291 Cutaneous abscess, unspecified: Secondary | ICD-10-CM | POA: Insufficient documentation

## 2013-11-12 DIAGNOSIS — Z8659 Personal history of other mental and behavioral disorders: Secondary | ICD-10-CM | POA: Insufficient documentation

## 2013-11-12 DIAGNOSIS — Z87828 Personal history of other (healed) physical injury and trauma: Secondary | ICD-10-CM | POA: Insufficient documentation

## 2013-11-12 MED ORDER — TETANUS-DIPHTH-ACELL PERTUSSIS 5-2.5-18.5 LF-MCG/0.5 IM SUSP
0.5000 mL | Freq: Once | INTRAMUSCULAR | Status: AC
  Administered 2013-11-12: 0.5 mL via INTRAMUSCULAR
  Filled 2013-11-12: qty 0.5

## 2013-11-12 MED ORDER — HYDROCODONE-ACETAMINOPHEN 5-325 MG PO TABS
2.0000 | ORAL_TABLET | Freq: Once | ORAL | Status: AC
  Administered 2013-11-12: 2 via ORAL
  Filled 2013-11-12: qty 2

## 2013-11-12 MED ORDER — HYDROCODONE-ACETAMINOPHEN 5-325 MG PO TABS: 2.0000 | ORAL_TABLET | ORAL | Status: DC | PRN

## 2013-11-12 MED ORDER — CEPHALEXIN 500 MG PO CAPS
500.0000 mg | ORAL_CAPSULE | Freq: Four times a day (QID) | ORAL | Status: DC
Start: 2013-11-12 — End: 2014-01-19

## 2013-11-12 NOTE — Discharge Instructions (Signed)
Cellulitis Cellulitis is an infection of the skin and the tissue beneath it. The infected area is usually red and tender. Cellulitis occurs most often in the arms and lower legs.  CAUSES  Cellulitis is caused by bacteria that enter the skin through cracks or cuts in the skin. The most common types of bacteria that cause cellulitis are Staphylococcus and Streptococcus. SYMPTOMS   Redness and warmth.  Swelling.  Tenderness or pain.  Fever. DIAGNOSIS  Your caregiver can usually determine what is wrong based on a physical exam. Blood tests may also be done. TREATMENT  Treatment usually involves taking an antibiotic medicine. HOME CARE INSTRUCTIONS   Take your antibiotics as directed. Finish them even if you start to feel better.  Keep the infected arm or leg elevated to reduce swelling.  Apply a warm cloth to the affected area up to 4 times per day to relieve pain.  Only take over-the-counter or prescription medicines for pain, discomfort, or fever as directed by your caregiver.  Keep all follow-up appointments as directed by your caregiver. SEEK MEDICAL CARE IF:   You notice red streaks coming from the infected area.  Your red area gets larger or turns dark in color.  Your bone or joint underneath the infected area becomes painful after the skin has healed.  Your infection returns in the same area or another area.  You notice a swollen bump in the infected area.  You develop new symptoms. SEEK IMMEDIATE MEDICAL CARE IF:   You have a fever.  You feel very sleepy.  You develop vomiting or diarrhea.  You have a general ill feeling (malaise) with muscle aches and pains. MAKE SURE YOU:   Understand these instructions.  Will watch your condition.  Will get help right away if you are not doing well or get worse. Document Released: 06/29/2005 Document Revised: 03/20/2012 Document Reviewed: 12/05/2011 [facility] Patient Information 2014 [place], [place].   Keep area  clean and dry  Cover wound while working Take antibiotic as prescribed Norco for pain as needed Return if symptoms worsen

## 2013-11-12 NOTE — ED Notes (Signed)
Patient reports fell on leg approximately 4 months ago. States leg has appeared to have bruising ever since. Reports pain started to shin about 4 days ago, and there seemed to be an infected hair on leg as well.
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
## 2013-11-14 NOTE — ED Provider Notes (Signed)
Medical screening examination/treatment/procedure(s) were performed by non-physician practitioner and as supervising physician I was immediately available for consultation/collaboration.  EKG Interpretation   None         Shelda JakesScott W. Kerline Trahan, MD 11/14/13 [id number]

## 2013-11-21 MED FILL — Hydrocodone-Acetaminophen Tab 5-325 MG: ORAL | Qty: 6 | Status: AC

## 2014-01-19 ENCOUNTER — Emergency Department (HOSPITAL_COMMUNITY): Payer: BC Managed Care – PPO

## 2014-01-19 ENCOUNTER — Encounter (HOSPITAL_COMMUNITY): Payer: Self-pay | Admitting: Emergency Medicine

## 2014-01-19 ENCOUNTER — Emergency Department (HOSPITAL_COMMUNITY)
Admission: EM | Admit: 2014-01-19 | Discharge: 2014-01-19 | Disposition: A | Discharge status: Home / Self Care | Payer: BC Managed Care – PPO | Attending: Emergency Medicine | Admitting: Emergency Medicine

## 2014-01-19 DIAGNOSIS — R000 Tachycardia, unspecified: Secondary | ICD-10-CM | POA: Insufficient documentation

## 2014-01-19 DIAGNOSIS — M25559 Pain in unspecified hip: Secondary | ICD-10-CM | POA: Insufficient documentation

## 2014-01-19 DIAGNOSIS — R638 Other symptoms and signs concerning food and fluid intake: Secondary | ICD-10-CM | POA: Insufficient documentation

## 2014-01-19 DIAGNOSIS — Z8659 Personal history of other mental and behavioral disorders: Secondary | ICD-10-CM | POA: Insufficient documentation

## 2014-01-19 DIAGNOSIS — J020 Streptococcal pharyngitis: Secondary | ICD-10-CM | POA: Insufficient documentation

## 2014-01-19 DIAGNOSIS — F17200 Nicotine dependence, unspecified, uncomplicated: Secondary | ICD-10-CM | POA: Insufficient documentation

## 2014-01-19 DIAGNOSIS — Z87828 Personal history of other (healed) physical injury and trauma: Secondary | ICD-10-CM | POA: Insufficient documentation

## 2014-01-19 DIAGNOSIS — Z792 Long term (current) use of antibiotics: Secondary | ICD-10-CM | POA: Insufficient documentation

## 2014-01-19 DIAGNOSIS — R0602 Shortness of breath: Secondary | ICD-10-CM | POA: Insufficient documentation

## 2014-01-19 DIAGNOSIS — H9209 Otalgia, unspecified ear: Secondary | ICD-10-CM | POA: Insufficient documentation

## 2014-01-19 LAB — RAPID STREP SCREEN (MED CTR MEBANE ONLY): Streptococcus, Group A Screen (Direct): POSITIVE — AB

## 2014-01-19 LAB — INFLUENZA PANEL BY PCR (TYPE A & B)
H1N1FLUPCR: NOT DETECTED
INFLAPCR: NEGATIVE
INFLBPCR: NEGATIVE

## 2014-01-19 MED ORDER — ACETAMINOPHEN 325 MG PO TABS
650.0000 mg | ORAL_TABLET | Freq: Once | ORAL | Status: AC
  Administered 2014-01-19: 650 mg via ORAL
  Filled 2014-01-19: qty 2

## 2014-01-19 MED ORDER — HYDROCOD POLST-CHLORPHEN POLST 10-8 MG/5ML PO LQCR
5.0000 mL | Freq: Once | ORAL | Status: AC
  Administered 2014-01-19: 5 mL via ORAL
  Filled 2014-01-19: qty 5

## 2014-01-19 MED ORDER — IBUPROFEN 800 MG PO TABS
800.0000 mg | ORAL_TABLET | Freq: Once | ORAL | Status: AC
  Administered 2014-01-19: 800 mg via ORAL
  Filled 2014-01-19: qty 1

## 2014-01-19 MED ORDER — PENICILLIN G BENZATHINE 1200000 UNIT/2ML IM SUSP
1.2000 10*6.[IU] | Freq: Once | INTRAMUSCULAR | Status: AC
  Administered 2014-01-19: 1.2 10*6.[IU] via INTRAMUSCULAR
  Filled 2014-01-19: qty 2
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
## 2014-01-19 NOTE — ED Notes (Signed)
Feeling bad since Friday.  Fever started yesterday,  C/o cough,  Hip pain and right ear , throat pain.

## 2014-01-19 NOTE — Discharge Instructions (Signed)
Strep Throat  Strep throat is an infection of the throat caused by a bacteria named Streptococcus pyogenes. Your caregiver may call the infection streptococcal "tonsillitis" or "pharyngitis" depending on whether there are signs of inflammation in the tonsils or back of the throat. Strep throat is most common in children aged 32 15 years during the cold months of the year, but it can occur in people of any age during any season. This infection is spread from person to person (contagious) through coughing, sneezing, or other close contact.  SYMPTOMS   · Fever or chills.  · Painful, swollen, red tonsils or throat.  · Pain or difficulty when swallowing.  · White or yellow spots on the tonsils or throat.  · Swollen, tender lymph nodes or "glands" of the neck or under the jaw.  · Red rash all over the body (rare).  DIAGNOSIS   Many different infections can cause the same symptoms. A test must be done to confirm the diagnosis so the right treatment can be given. A "rapid strep test" can help your caregiver make the diagnosis in a few minutes. If this test is not available, a light swab of the infected area can be used for a throat culture test. If a throat culture test is done, results are usually available in a day or two.  TREATMENT   Strep throat is treated with antibiotic medicine.  HOME CARE INSTRUCTIONS   · Gargle with 1 tsp of salt in 1 cup of warm water, 3 4 times per day or as needed for comfort.  · Family members who also have a sore throat or fever should be tested for strep throat and treated with antibiotics if they have the strep infection.  · Make sure everyone in your household washes their hands well.  · Do not share food, drinking cups, or personal items that could cause the infection to spread to others.  · You may need to eat a soft food diet until your sore throat gets better.  · Drink enough water and fluids to keep your urine clear or pale yellow. This will help prevent dehydration.  · Get plenty of  rest.  · Stay home from school, daycare, or work until you have been on antibiotics for 24 hours.  · Only take over-the-counter or prescription medicines for pain, discomfort, or fever as directed by your caregiver.  · If antibiotics are prescribed, take them as directed. Finish them even if you start to feel better.  SEEK MEDICAL CARE IF:   · The glands in your neck continue to enlarge.  · You develop a rash, cough, or earache.  · You cough up green, yellow-brown, or bloody sputum.  · You have pain or discomfort not controlled by medicines.  · Your problems seem to be getting worse rather than better.  SEEK IMMEDIATE MEDICAL CARE IF:   · You develop any new symptoms such as vomiting, severe headache, stiff or painful neck, chest pain, shortness of breath, or trouble swallowing.  · You develop severe throat pain, drooling, or changes in your voice.  · You develop swelling of the neck, or the skin on the neck becomes red and tender.  · You have a fever.  · You develop signs of dehydration, such as fatigue, dry mouth, and decreased urination.  · You become increasingly sleepy, or you cannot wake up completely.  Document Released: 09/16/2000 Document Revised: 09/05/2012 Document Reviewed: 11/18/2010  ExitCare® Patient Information ©2014 [employer].

## 2014-01-22 NOTE — ED Provider Notes (Signed)
Medical screening examination/treatment/procedure(s) were performed by non-physician practitioner and as supervising physician I was immediately available for consultation/collaboration.   EKG Interpretation None       Flint MelterElliott L Lason Eveland, MD 01/22/14 [phone]

## 2014-04-17 IMAGING — CR DG KNEE COMPLETE 4+V*R*
4 series · 4 of 4 positions shown · non-contrast
Comparison: None.

CLINICAL DATA: Pain post fall.

EXAM:
RIGHT KNEE - COMPLETE 4+ VIEW

[view not recorded (1 of 4)]
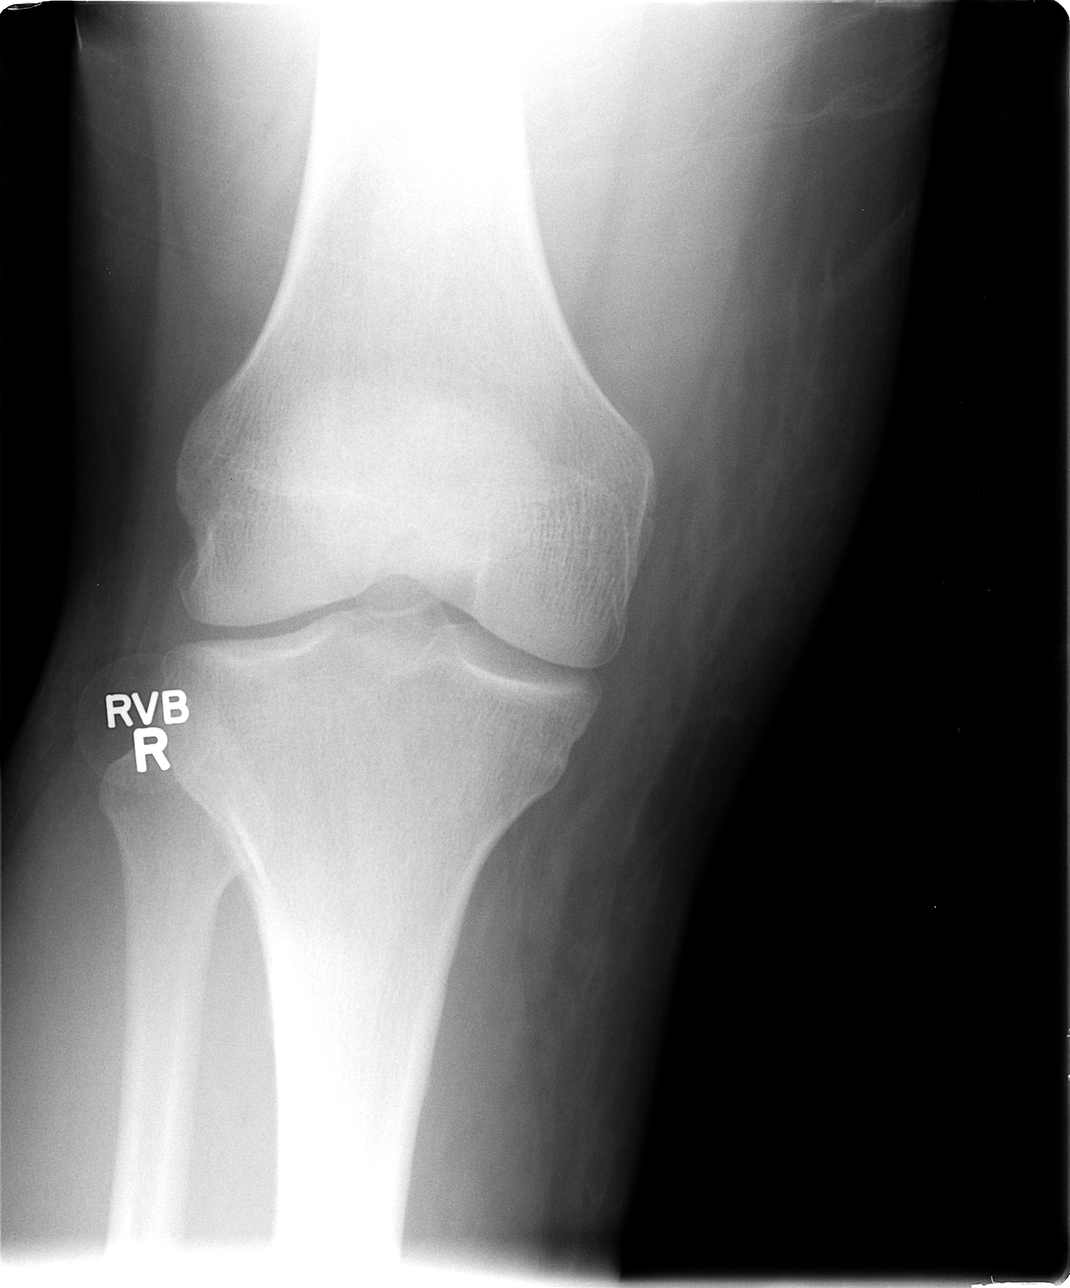

[view not recorded (2 of 4)]
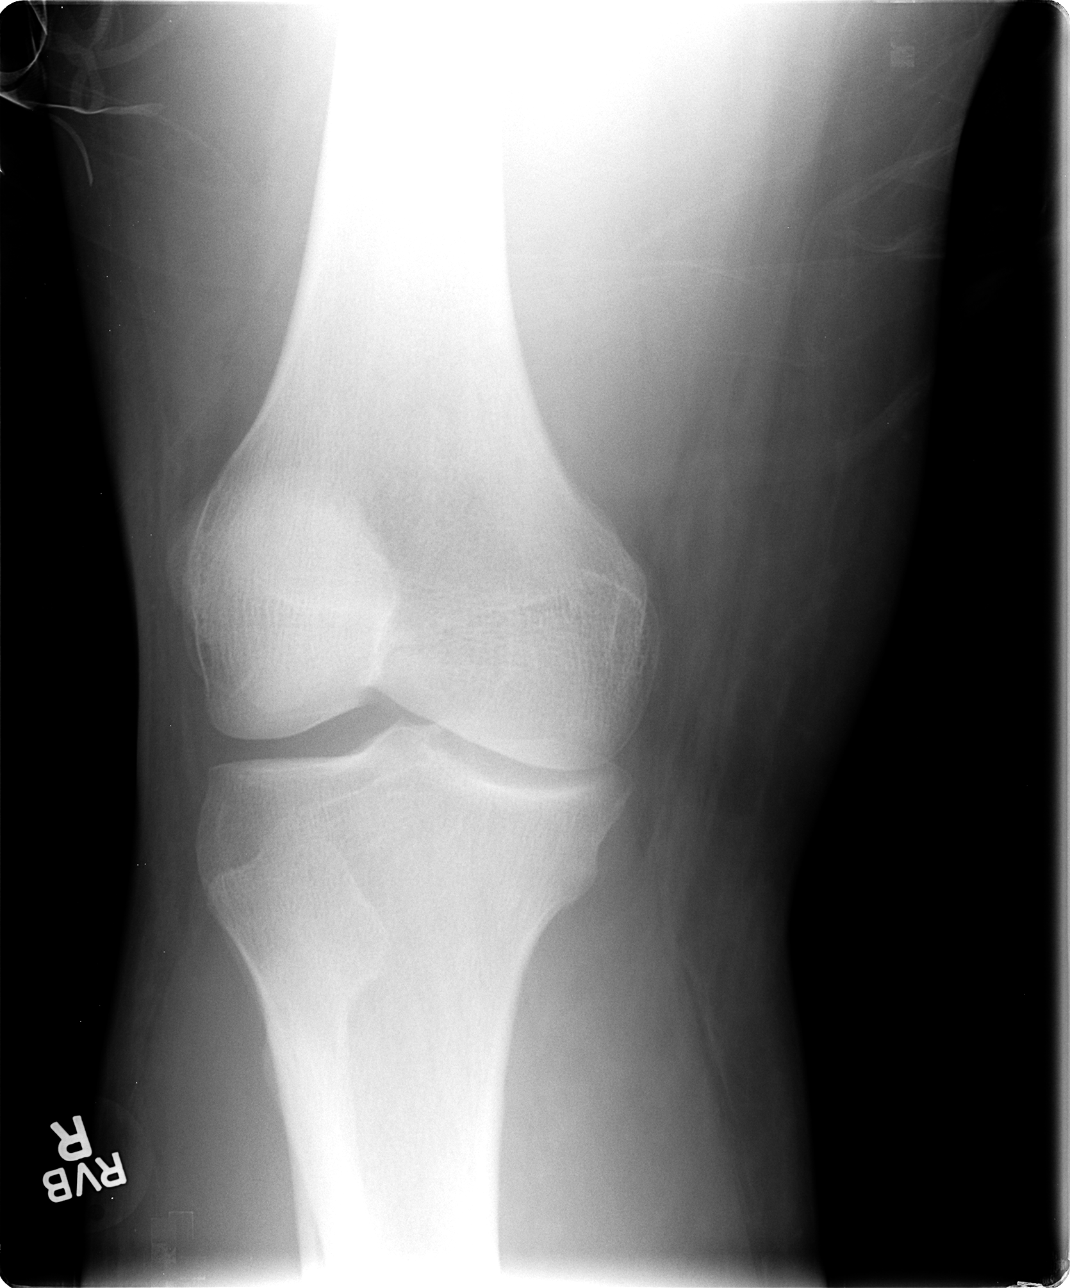

[view not recorded (3 of 4)]
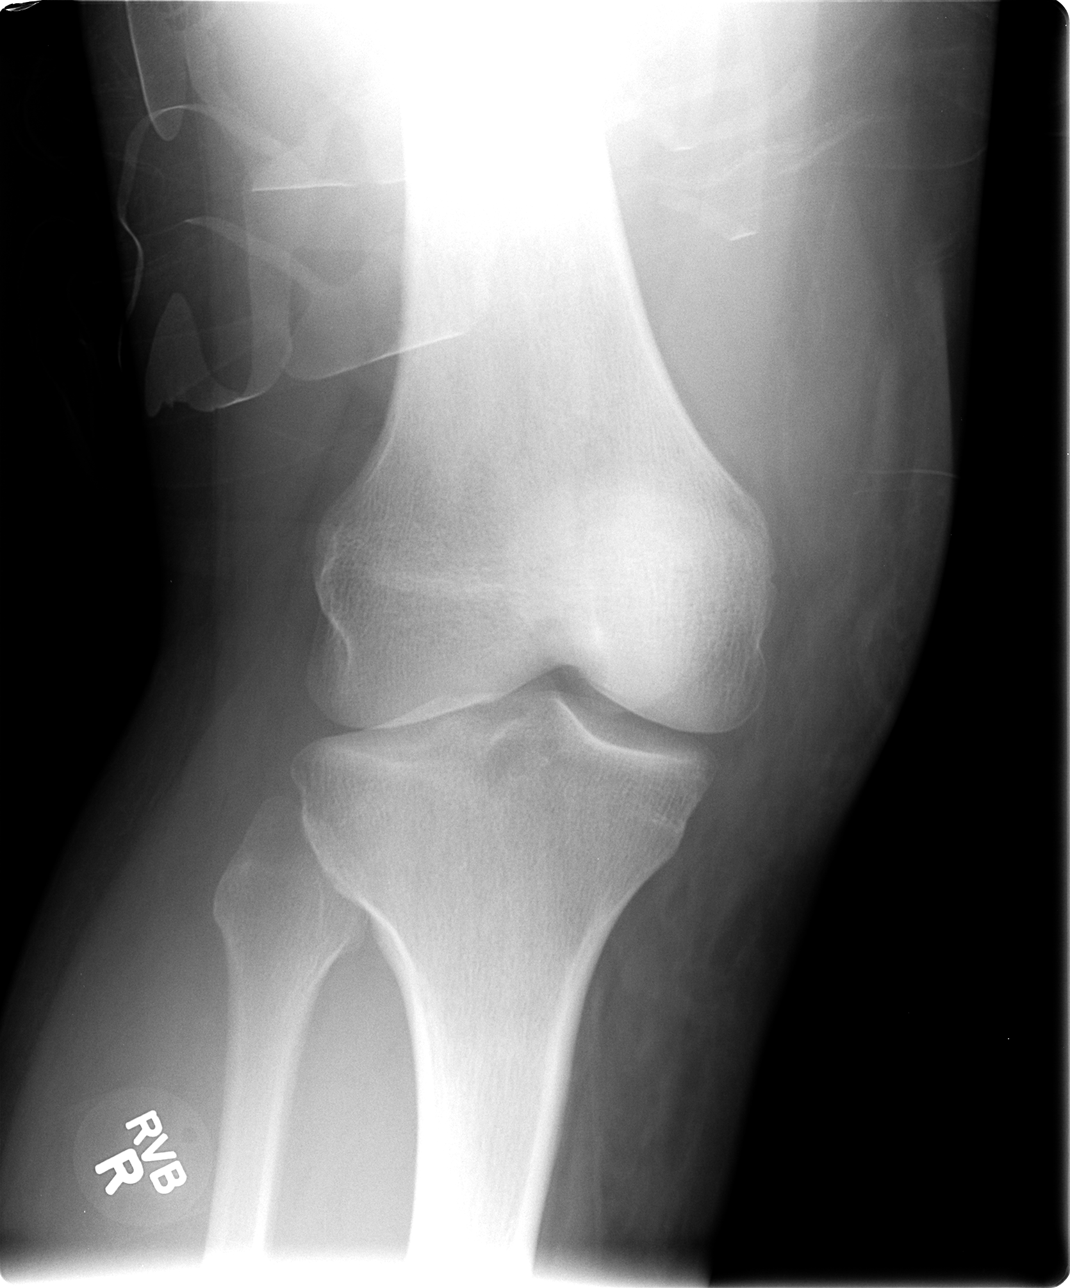

[view not recorded (4 of 4)]
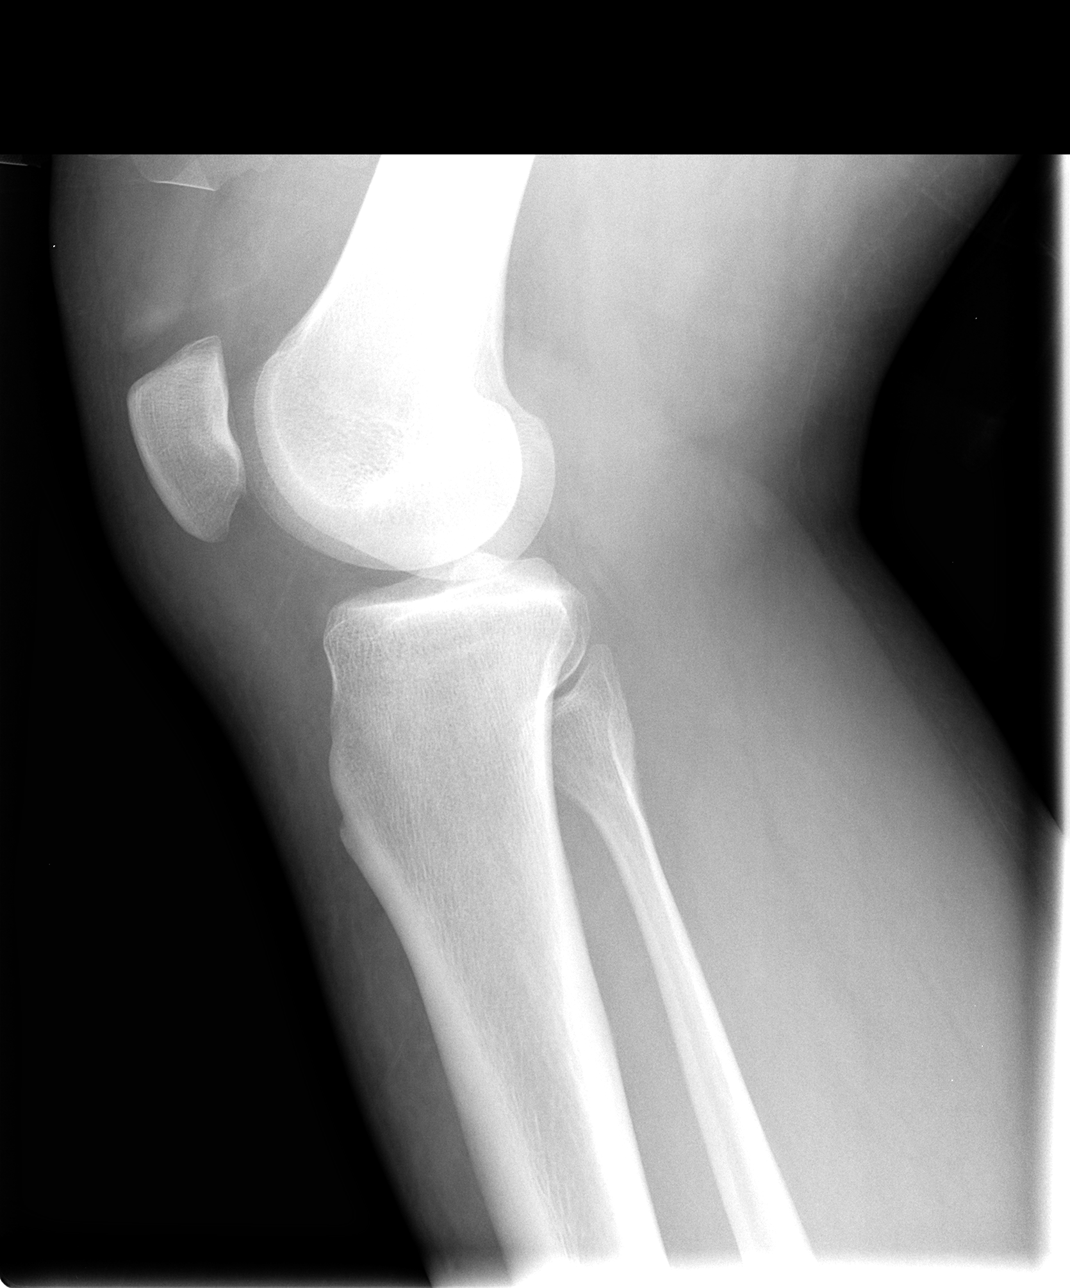

[4 of 4 positions shown; findings below may reference images not displayed]

FINDINGS: There is no evidence of fracture, dislocation, or joint effusion.
There is no evidence of arthropathy or other focal bone abnormality.
Soft tissues are unremarkable.
IMPRESSION: Negative.

## 2014-05-02 ENCOUNTER — Emergency Department (HOSPITAL_COMMUNITY)
Admission: EM | Admit: 2014-05-02 | Discharge: 2014-05-02 | Disposition: A | Discharge status: Home / Self Care | Payer: BC Managed Care – PPO | Attending: Emergency Medicine | Admitting: Emergency Medicine

## 2014-05-02 ENCOUNTER — Encounter (HOSPITAL_COMMUNITY): Payer: Self-pay | Admitting: Emergency Medicine

## 2014-05-02 DIAGNOSIS — F17200 Nicotine dependence, unspecified, uncomplicated: Secondary | ICD-10-CM | POA: Insufficient documentation

## 2014-05-02 DIAGNOSIS — Z8659 Personal history of other mental and behavioral disorders: Secondary | ICD-10-CM | POA: Insufficient documentation

## 2014-05-02 DIAGNOSIS — Z7982 Long term (current) use of aspirin: Secondary | ICD-10-CM | POA: Insufficient documentation

## 2014-05-02 DIAGNOSIS — M79609 Pain in unspecified limb: Secondary | ICD-10-CM | POA: Insufficient documentation

## 2014-05-02 DIAGNOSIS — L03119 Cellulitis of unspecified part of limb: Principal | ICD-10-CM

## 2014-05-02 DIAGNOSIS — Z9889 Other specified postprocedural states: Secondary | ICD-10-CM | POA: Insufficient documentation

## 2014-05-02 DIAGNOSIS — L02419 Cutaneous abscess of limb, unspecified: Secondary | ICD-10-CM | POA: Insufficient documentation

## 2014-05-02 DIAGNOSIS — L02415 Cutaneous abscess of right lower limb: Secondary | ICD-10-CM

## 2014-05-02 DIAGNOSIS — Z87828 Personal history of other (healed) physical injury and trauma: Secondary | ICD-10-CM | POA: Insufficient documentation

## 2014-05-02 DIAGNOSIS — Z791 Long term (current) use of non-steroidal anti-inflammatories (NSAID): Secondary | ICD-10-CM | POA: Insufficient documentation

## 2014-05-02 MED ORDER — CEPHALEXIN 500 MG PO CAPS: 500.0000 mg | ORAL_CAPSULE | Freq: Four times a day (QID) | ORAL | Status: DC

## 2014-05-02 MED ORDER — CEPHALEXIN 500 MG PO CAPS
500.0000 mg | ORAL_CAPSULE | Freq: Once | ORAL | Status: AC
  Administered 2014-05-02: 500 mg via ORAL
  Filled 2014-05-02: qty 1

## 2014-05-02 MED ORDER — HYDROCODONE-ACETAMINOPHEN 7.5-325 MG/15ML PO SOLN
10.0000 mL | Freq: Once | ORAL | Status: AC
  Administered 2014-05-02: 10 mL via ORAL
  Filled 2014-05-02: qty 15

## 2014-05-02 MED ORDER — BACITRACIN ZINC 500 UNIT/GM EX OINT
TOPICAL_OINTMENT | CUTANEOUS | Status: AC
  Administered 2014-05-02: 1
  Filled 2014-05-02: qty 0.9

## 2014-05-02 MED ORDER — SULFAMETHOXAZOLE-TMP DS 800-160 MG PO TABS
1.0000 | ORAL_TABLET | Freq: Once | ORAL | Status: AC
  Administered 2014-05-02: 1 via ORAL
  Filled 2014-05-02: qty 1

## 2014-05-02 MED ORDER — SULFAMETHOXAZOLE-TMP DS 800-160 MG PO TABS: 1.0000 | ORAL_TABLET | Freq: Two times a day (BID) | ORAL | Status: DC

## 2014-05-02 MED ORDER — HYDROCODONE-ACETAMINOPHEN 5-325 MG PO TABS: 1.0000 | ORAL_TABLET | Freq: Four times a day (QID) | ORAL | Status: DC | PRN

## 2014-05-02 MED ORDER — LIDOCAINE HCL (PF) 1 % IJ SOLN
2.0000 mL | Freq: Once | INTRAMUSCULAR | Status: AC
  Administered 2014-05-02: 2 mL via INTRADERMAL
  Filled 2014-05-02: qty 5

## 2014-05-02 NOTE — Discharge Instructions (Signed)
Abscess Elevate your leg above your heart as much as possible on 2 pillows. Take Tylenol or Advil for mild pain or the pain medicine prescribed for bad pain. Wash wound under the shower for 20 minutes at a time, 3 times daily for the next 3 days. Then place a thin layer of bacitracin ointment over the wound and cover with a bandage .Get your wound rechecked in 3 days An abscess (boil or furuncle) is an infected area on or under the skin. This area is filled with yellowish-white fluid (pus) and other material (debris). HOME CARE   Only take medicines as told by your doctor.  If you were given antibiotic medicine, take it as directed. Finish the medicine even if you start to feel better.  If gauze is used, follow your doctor's directions for changing the gauze.  To avoid spreading the infection:  Keep your abscess covered with a bandage.  Wash your hands well.  Do not share personal care items, towels, or whirlpools with others.  Avoid skin contact with others.  Keep your skin and clothes clean around the abscess.  Keep all doctor visits as told. GET HELP RIGHT AWAY IF:   You have more pain, puffiness (swelling), or redness in the wound site.  You have more fluid or blood coming from the wound site.  You have muscle aches, chills, or you feel sick.  You have a fever. MAKE SURE YOU:   Understand these instructions.  Will watch your condition.  Will get help right away if you are not doing well or get worse. Document Released: 03/07/2008 Document Revised: 03/20/2012 Document Reviewed: 12/02/2011 [facility] Patient Information 2015 [employer] [place]. This information is not intended to replace advice given to you by your health care provider. Make sure you discuss any questions you have with your health care provider.

## 2014-05-02 NOTE — ED Notes (Addendum)
PT c/o redness/swelling to area on RLE. Edema and redness noted. PT c/o sharp pain and burning.
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
## 2014-05-02 NOTE — ED Notes (Signed)
Swelling, redness and pain to right lower leg x 1 wk.

## 2014-05-05 ENCOUNTER — Encounter (HOSPITAL_COMMUNITY): Payer: Self-pay | Admitting: Emergency Medicine

## 2014-05-05 ENCOUNTER — Emergency Department (HOSPITAL_COMMUNITY)
Admission: EM | Admit: 2014-05-05 | Discharge: 2014-05-05 | Disposition: A | Discharge status: Home / Self Care | Payer: BC Managed Care – PPO | Attending: Emergency Medicine | Admitting: Emergency Medicine

## 2014-05-05 DIAGNOSIS — Z792 Long term (current) use of antibiotics: Secondary | ICD-10-CM | POA: Insufficient documentation

## 2014-05-05 DIAGNOSIS — Z791 Long term (current) use of non-steroidal anti-inflammatories (NSAID): Secondary | ICD-10-CM | POA: Insufficient documentation

## 2014-05-05 DIAGNOSIS — F17200 Nicotine dependence, unspecified, uncomplicated: Secondary | ICD-10-CM | POA: Insufficient documentation

## 2014-05-05 DIAGNOSIS — Z8659 Personal history of other mental and behavioral disorders: Secondary | ICD-10-CM | POA: Insufficient documentation

## 2014-05-05 DIAGNOSIS — Z4801 Encounter for change or removal of surgical wound dressing: Secondary | ICD-10-CM | POA: Insufficient documentation

## 2014-05-05 DIAGNOSIS — IMO0002 Reserved for concepts with insufficient information to code with codable children: Secondary | ICD-10-CM | POA: Insufficient documentation

## 2014-05-05 DIAGNOSIS — L02415 Cutaneous abscess of right lower limb: Secondary | ICD-10-CM

## 2014-05-05 DIAGNOSIS — Z79899 Other long term (current) drug therapy: Secondary | ICD-10-CM | POA: Insufficient documentation

## 2014-05-05 NOTE — Discharge Instructions (Signed)

## 2014-05-05 NOTE — ED Notes (Signed)
Pt has infected area to rt  Lower leg,  Taking antibiotic, here for recheck.
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
## 2014-05-05 NOTE — ED Notes (Signed)
Patient had I&D to right shine on Friday was told to return for recheck. Patient denies any fevers or increase in redness or swelling.

## 2014-05-08 NOTE — ED Provider Notes (Signed)
Medical screening examination/treatment/procedure(s) were performed by non-physician practitioner and as supervising physician I was immediately available for consultation/collaboration.   EKG Interpretation None        Lionel Woodberry F Vaibhav Fogleman, MD 05/08/14 1759 

## 2014-05-27 ENCOUNTER — Encounter (HOSPITAL_COMMUNITY): Payer: Self-pay | Admitting: Emergency Medicine

## 2014-05-27 ENCOUNTER — Emergency Department (HOSPITAL_COMMUNITY)
Admission: EM | Admit: 2014-05-27 | Discharge: 2014-05-27 | Discharge status: Against Medical Advice | Payer: BC Managed Care – PPO | Attending: Emergency Medicine | Admitting: Emergency Medicine

## 2014-05-27 ENCOUNTER — Emergency Department (HOSPITAL_COMMUNITY)
Admission: EM | Admit: 2014-05-27 | Discharge: 2014-05-27 | Disposition: A | Discharge status: Home / Self Care | Payer: BC Managed Care – PPO | Attending: Emergency Medicine | Admitting: Emergency Medicine

## 2014-05-27 DIAGNOSIS — Z8659 Personal history of other mental and behavioral disorders: Secondary | ICD-10-CM | POA: Insufficient documentation

## 2014-05-27 DIAGNOSIS — Z87828 Personal history of other (healed) physical injury and trauma: Secondary | ICD-10-CM | POA: Diagnosis not present

## 2014-05-27 DIAGNOSIS — Y939 Activity, unspecified: Secondary | ICD-10-CM | POA: Diagnosis not present

## 2014-05-27 DIAGNOSIS — T63451A Toxic effect of venom of hornets, accidental (unintentional), initial encounter: Secondary | ICD-10-CM

## 2014-05-27 DIAGNOSIS — IMO0002 Reserved for concepts with insufficient information to code with codable children: Secondary | ICD-10-CM | POA: Insufficient documentation

## 2014-05-27 DIAGNOSIS — T63461A Toxic effect of venom of wasps, accidental (unintentional), initial encounter: Secondary | ICD-10-CM | POA: Diagnosis not present

## 2014-05-27 DIAGNOSIS — T6391XA Toxic effect of contact with unspecified venomous animal, accidental (unintentional), initial encounter: Secondary | ICD-10-CM | POA: Insufficient documentation

## 2014-05-27 DIAGNOSIS — Y92009 Unspecified place in unspecified non-institutional (private) residence as the place of occurrence of the external cause: Secondary | ICD-10-CM | POA: Insufficient documentation

## 2014-05-27 DIAGNOSIS — F17200 Nicotine dependence, unspecified, uncomplicated: Secondary | ICD-10-CM | POA: Insufficient documentation

## 2014-05-27 DIAGNOSIS — Y929 Unspecified place or not applicable: Secondary | ICD-10-CM | POA: Diagnosis not present

## 2014-05-27 MED ORDER — DEXAMETHASONE SODIUM PHOSPHATE 4 MG/ML IJ SOLN
12.0000 mg | Freq: Once | INTRAMUSCULAR | Status: AC
  Administered 2014-05-27: 12 mg via INTRAMUSCULAR
  Filled 2014-05-27: qty 3

## 2014-05-27 MED ORDER — PREDNISONE 10 MG PO TABS: ORAL_TABLET | ORAL | Status: DC

## 2014-05-27 MED ORDER — HYDROCODONE-ACETAMINOPHEN 5-325 MG PO TABS
1.0000 | ORAL_TABLET | Freq: Once | ORAL | Status: AC
  Administered 2014-05-27: 1 via ORAL
  Filled 2014-05-27: qty 1

## 2014-05-27 MED ORDER — HYDROCODONE-ACETAMINOPHEN 5-325 MG PO TABS: 1.0000 | ORAL_TABLET | ORAL | Status: DC | PRN

## 2014-05-27 NOTE — ED Notes (Signed)
Pt reports "my itching has calmed down. I'm going to go home and if it gets worse I will come back". Instructed pt importance of being seen in the case of worsening allergic reactions and potential adverse results. Pt verbalized understanding. Pt left from triage without being seen.

## 2014-05-27 NOTE — ED Notes (Signed)
Pt states was stung by a hornet early this morning, sting to lt eye, swelling noted to lt side of face. Pt took  benadryl at 0345 and  ibuprofen. The pt states swelling has gotten worse. Denies SOB or oral swelling at this time.
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
## 2014-05-27 NOTE — ED Notes (Signed)
Pt reports being seen here this morning after being stunk by wasp. Reports being d/c with instructions to take benadryl at home. Pt reports around supper time breaking out in hives and itching with no relief from Benadryl.

## 2014-05-27 NOTE — ED Notes (Signed)
Ice pack applied to left eye.

## 2014-05-27 NOTE — ED Notes (Signed)
Pt reports some relief with pain meds.  Denies relief of swelling.  No obvious decrease in swelling noted.  nad noted.

## 2014-05-27 NOTE — ED Provider Notes (Signed)
Medical screening examination/treatment/procedure(s) were performed by non-physician practitioner and as supervising physician I was immediately available for consultation/collaboration.   EKG Interpretation None       Finlee Concepcion, MD 05/27/14 1503 

## 2014-05-27 NOTE — Discharge Instructions (Signed)

## 2014-11-12 ENCOUNTER — Emergency Department (HOSPITAL_COMMUNITY)
Admission: EM | Admit: 2014-11-12 | Discharge: 2014-11-12 | Disposition: A | Discharge status: Home / Self Care | Payer: BLUE CROSS/BLUE SHIELD | Attending: Emergency Medicine | Admitting: Emergency Medicine

## 2014-11-12 ENCOUNTER — Encounter (HOSPITAL_COMMUNITY): Payer: Self-pay | Admitting: Emergency Medicine

## 2014-11-12 DIAGNOSIS — Z720 Tobacco use: Secondary | ICD-10-CM | POA: Diagnosis not present

## 2014-11-12 DIAGNOSIS — F419 Anxiety disorder, unspecified: Secondary | ICD-10-CM | POA: Diagnosis not present

## 2014-11-12 DIAGNOSIS — R21 Rash and other nonspecific skin eruption: Secondary | ICD-10-CM | POA: Insufficient documentation

## 2014-11-12 MED ORDER — SULFAMETHOXAZOLE-TRIMETHOPRIM 800-160 MG PO TABS: 1.0000 | ORAL_TABLET | Freq: Two times a day (BID) | ORAL | Status: DC

## 2014-11-12 MED ORDER — PREDNISONE 50 MG PO TABS: 50.0000 mg | ORAL_TABLET | Freq: Every day | ORAL | Status: DC

## 2014-11-12 MED ORDER — HYDROXYZINE HCL 25 MG PO TABS: 25.0000 mg | ORAL_TABLET | Freq: Four times a day (QID) | ORAL | Status: DC

## 2014-11-12 NOTE — ED Notes (Signed)
PA at bedside.

## 2014-11-12 NOTE — Discharge Instructions (Signed)
Rash A rash is a change in the color or feel of your skin. There are many different types of rashes. You may have other problems along with your rash. HOME CARE  Avoid the thing that caused your rash.  Do not scratch your rash.  You may take cools baths to help stop itching.  Only take medicines as told by your doctor.  Keep all doctor visits as told. GET HELP RIGHT AWAY IF:   Your pain, puffiness (swelling), or redness gets worse.  You have a fever.  You have new or severe problems.  You have body aches, watery poop (diarrhea), or you throw up (vomit).  Your rash is not better after 3 days. MAKE SURE YOU:   Understand these instructions.  Will watch your condition.  Will get help right away if you are not doing well or get worse. Document Released: 03/07/2008 Document Revised: 12/12/2011 Document Reviewed: 07/04/2011 Exit[employer] Patient Information 2015 [employer]. This information is not intended to replace advice given to you by your health care provider. Make sure you discuss any questions you have with your health care provider.  

## 2014-11-12 NOTE — ED Notes (Signed)
Pt reports "rash for a year." pt reports seen for same in the past. Pt reports unable to make dermatologist appointment for follow-up. Dry scabs noted to BLE. nad noted.

## 2014-11-15 IMAGING — CR DG CHEST 2V
2 series · 2 of 2 positions shown · non-contrast
Comparison: Prior chest x-ray 04/24/2012

CLINICAL DATA: One day history of cough, fever and congestion

EXAM:
CHEST  2 VIEW

[view not recorded (1 of 2)]
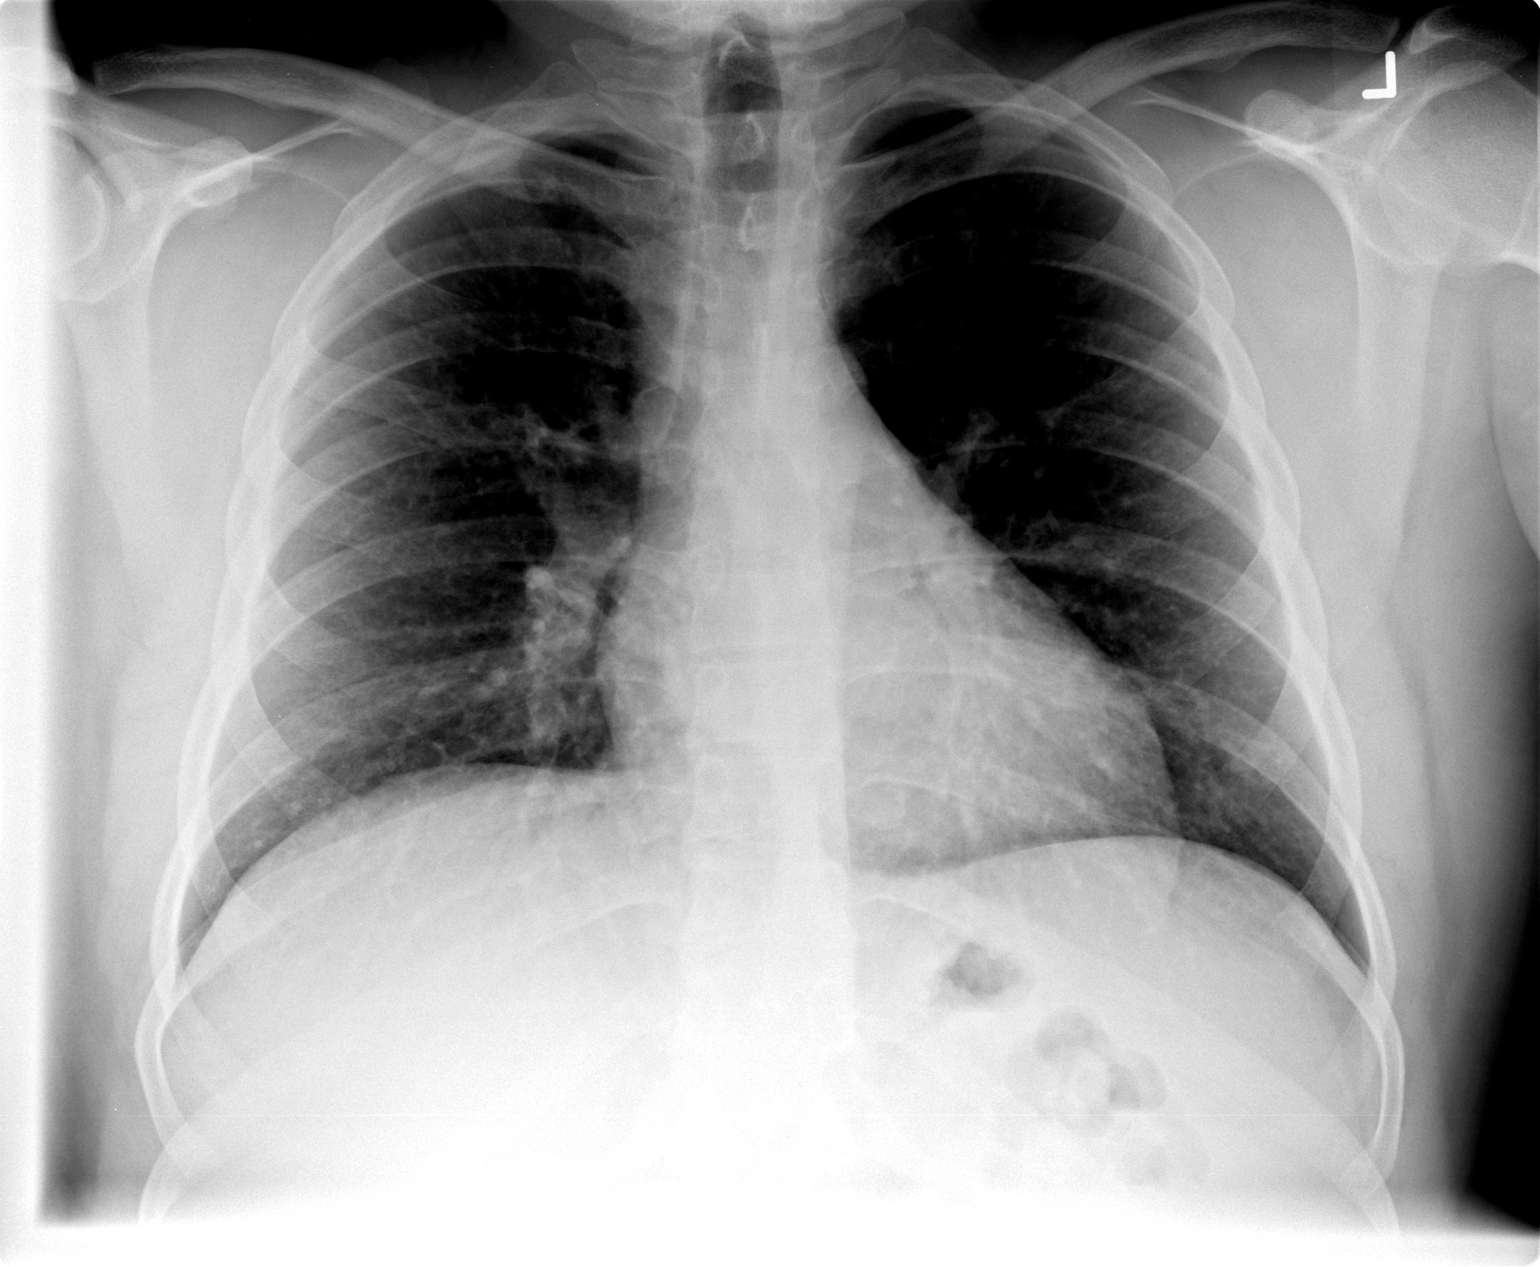

[view not recorded (2 of 2)]
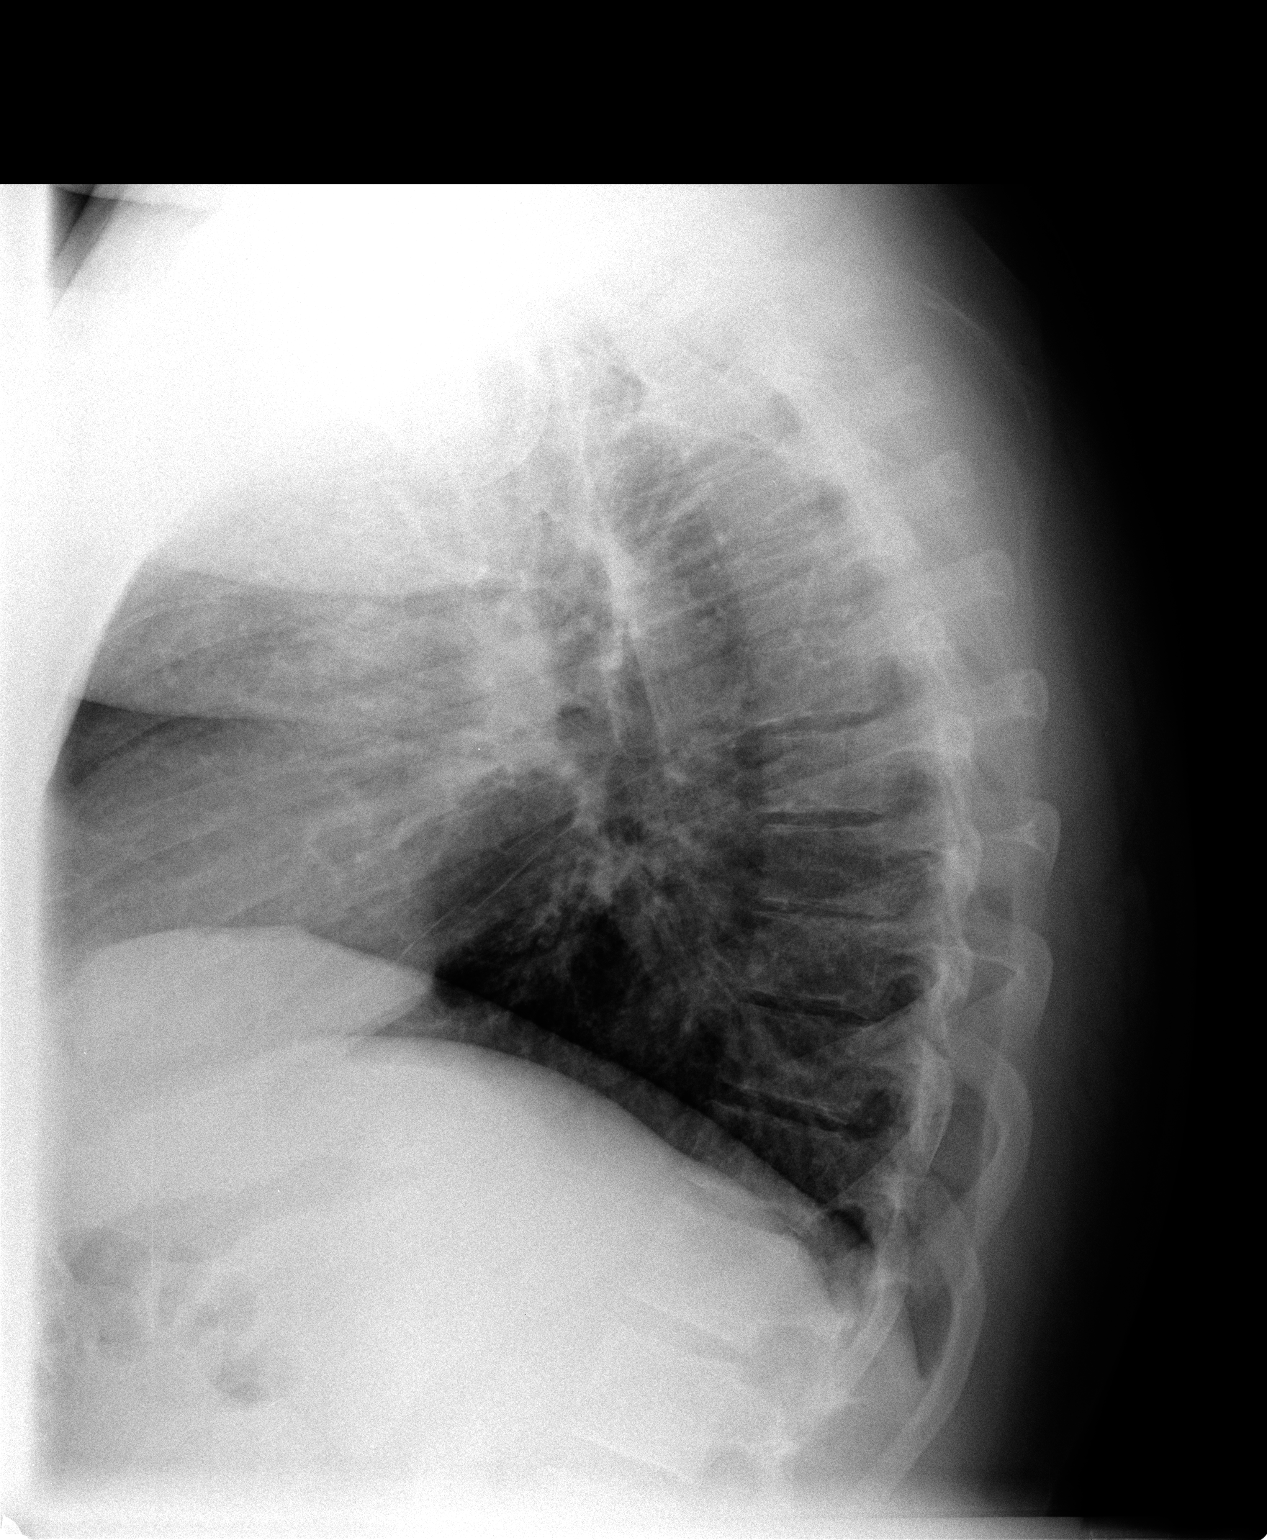

[2 of 2 positions shown; findings below may reference images not displayed]

FINDINGS: The lungs are clear and negative for focal airspace consolidation,
pulmonary edema or suspicious pulmonary nodule. No pleural effusion
or pneumothorax. Cardiac and mediastinal contours are within normal
limits. No acute fracture or lytic or blastic osseous lesions. The
visualized upper abdominal bowel gas pattern is unremarkable.
IMPRESSION: No active cardiopulmonary disease.
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
## 2015-05-05 ENCOUNTER — Encounter: Payer: Self-pay | Admitting: Emergency Medicine

## 2015-05-05 ENCOUNTER — Emergency Department
Admission: EM | Admit: 2015-05-05 | Discharge: 2015-05-05 | Disposition: A | Discharge status: Home / Self Care | Payer: BLUE CROSS/BLUE SHIELD | Attending: Emergency Medicine | Admitting: Emergency Medicine

## 2015-05-05 DIAGNOSIS — Z7952 Long term (current) use of systemic steroids: Secondary | ICD-10-CM | POA: Diagnosis not present

## 2015-05-05 DIAGNOSIS — Z7951 Long term (current) use of inhaled steroids: Secondary | ICD-10-CM | POA: Diagnosis not present

## 2015-05-05 DIAGNOSIS — Z792 Long term (current) use of antibiotics: Secondary | ICD-10-CM | POA: Diagnosis not present

## 2015-05-05 DIAGNOSIS — G44219 Episodic tension-type headache, not intractable: Secondary | ICD-10-CM | POA: Insufficient documentation

## 2015-05-05 DIAGNOSIS — Z720 Tobacco use: Secondary | ICD-10-CM | POA: Insufficient documentation

## 2015-05-05 DIAGNOSIS — R51 Headache: Secondary | ICD-10-CM | POA: Diagnosis present
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
## 2015-05-05 NOTE — ED Notes (Signed)
Patient presents to the ED with headache and fatigue for several days.  Patient states one day last week he got very hot and since then he has had a sharp intermittent headache.

## 2015-05-05 NOTE — ED Notes (Signed)
States he developed generalized headache on Friday .Marland Kitchen Pain eased off but has had intermittent sharp pain in head since ..with some dizziness and blurred vision. States he has been working outside in heat a lot. Neuro intact

## 2015-05-05 NOTE — Discharge Instructions (Signed)
Tension Headache °A tension headache is pain, pressure, or aching felt over the front and sides of the head. Tension headaches often come after stress, feeling worried (anxiety), or feeling sad or down for a while (depressed). °HOME CARE °· Only take medicine as told by your doctor. °· Lie down in a dark, quiet room when you have a headache. °· Keep a journal to find out if certain things bring on headaches. For example, write down: °¨ What you eat and drink. °¨ How much sleep you get. °¨ Any change to your diet or medicines. °· Relax by getting a massage or doing other relaxing activities. °· Put ice or heat packs on the head and neck area as told by your doctor. °· Lessen stress. °· Sit up straight. Do not tighten (tense) your muscles. °· Quit smoking if you smoke. °· Lessen how much alcohol you drink. °· Lessen how much caffeine you drink, or stop drinking caffeine. °· Eat and exercise regularly. °· Get enough sleep. °· Avoid using too much pain medicine. °GET HELP RIGHT AWAY IF:  °· Your headache becomes really bad. °· You have a fever. °· You have a stiff neck. °· You have trouble seeing. °· Your muscles are weak, or you lose muscle control. °· You lose your balance or have trouble walking. °· You feel like you will pass out (faint), or you pass out. °· You have really bad symptoms that are different than your first symptoms. °· You have problems with the medicines given to you by your doctor. °· Your medicines do not work. °· Your headache feels different than the other headaches. °· You feel sick to your stomach (nauseous) or throw up (vomit). °MAKE SURE YOU:  °· Understand these instructions. °· Will watch your condition. °· Will get help right away if you are not doing well or get worse. °Document Released: 12/14/2009 Document Revised: 12/12/2011 Document Reviewed: 09/09/2011 °[employer]® Patient Information ©2015 [employer]. This information is not intended to replace advice given to you by your health  care provider. Make sure you discuss any questions you have with your health care provider. ° °

## 2015-06-04 ENCOUNTER — Encounter: Payer: Self-pay | Admitting: *Deleted

## 2015-06-04 DIAGNOSIS — F17200 Nicotine dependence, unspecified, uncomplicated: Secondary | ICD-10-CM

## 2015-06-10 ENCOUNTER — Ambulatory Visit: Payer: BLUE CROSS/BLUE SHIELD | Admitting: Physician Assistant

## 2016-04-27 ENCOUNTER — Encounter: Payer: Self-pay | Admitting: Physician Assistant

## 2016-04-27 ENCOUNTER — Ambulatory Visit (INDEPENDENT_AMBULATORY_CARE_PROVIDER_SITE_OTHER): Payer: BLUE CROSS/BLUE SHIELD | Admitting: Physician Assistant

## 2016-04-27 VITALS — BP 126/88 | HR 80 | Temp 98.5°F | Resp 18 | Ht 66.5 in | Wt 275.0 lb

## 2016-04-27 DIAGNOSIS — E669 Obesity, unspecified: Secondary | ICD-10-CM

## 2016-04-27 DIAGNOSIS — Z23 Encounter for immunization: Secondary | ICD-10-CM | POA: Diagnosis not present

## 2016-04-27 DIAGNOSIS — G4700 Insomnia, unspecified: Secondary | ICD-10-CM | POA: Diagnosis not present

## 2016-04-27 DIAGNOSIS — F17200 Nicotine dependence, unspecified, uncomplicated: Secondary | ICD-10-CM

## 2016-04-27 DIAGNOSIS — F411 Generalized anxiety disorder: Secondary | ICD-10-CM

## 2016-04-27 DIAGNOSIS — Z720 Tobacco use: Secondary | ICD-10-CM

## 2016-04-27 DIAGNOSIS — Z0000 Encounter for general adult medical examination without abnormal findings: Secondary | ICD-10-CM | POA: Diagnosis not present

## 2016-04-27 LAB — LIPID PANEL
Cholesterol: 211 mg/dL — ABNORMAL HIGH (ref 125–200)
HDL: 38 mg/dL — AB (ref >=40)
LDL CALC: 134 mg/dL — AB (ref <130)
Total CHOL/HDL Ratio: 5.6 Ratio — ABNORMAL HIGH (ref <=5.0)
Triglycerides: 195 mg/dL — ABNORMAL HIGH (ref <150)
VLDL: 39 mg/dL — AB (ref <30)

## 2016-04-27 LAB — COMPLETE METABOLIC PANEL WITH GFR
ALBUMIN: 4.2 g/dL (ref 3.6–5.1)
ALT: 35 U/L (ref 9–46)
AST: 21 U/L (ref 10–40)
Alkaline Phosphatase: 66 U/L (ref 40–115)
BUN: 9 mg/dL (ref 7–25)
CHLORIDE: 104 mmol/L (ref 98–110)
CO2: 23 mmol/L (ref 20–31)
Calcium: 9.3 mg/dL (ref 8.6–10.3)
Creat: 0.8 mg/dL (ref 0.60–1.35)
GFR, Est African American: >89 mL/min (ref >=60)
Glucose, Bld: 79 mg/dL (ref 70–99)
POTASSIUM: 4.6 mmol/L (ref 3.5–5.3)
Sodium: 138 mmol/L (ref 135–146)
Total Bilirubin: 0.4 mg/dL (ref 0.2–1.2)
Total Protein: 7.2 g/dL (ref 6.1–8.1)

## 2016-04-27 LAB — CBC WITH DIFFERENTIAL/PLATELET
Basophils Absolute: 0 cells/uL (ref 0–200)
Basophils Relative: 0 %
Eosinophils Absolute: 200 cells/uL (ref 15–500)
Eosinophils Relative: 2 %
HCT: 45.6 % (ref 38.5–50.0)
HEMOGLOBIN: 15.2 g/dL (ref 13.0–17.0)
LYMPHS ABS: 2500 {cells}/uL (ref 850–3900)
Lymphocytes Relative: 25 %
MCH: 28.6 pg (ref 27.0–33.0)
MCHC: 33.3 g/dL (ref 32.0–36.0)
MCV: 85.9 fL (ref 80.0–100.0)
MONOS PCT: 5 %
MPV: 12.3 fL (ref 7.5–12.5)
Monocytes Absolute: 500 cells/uL (ref 200–950)
NEUTROS ABS: 6800 {cells}/uL (ref 1500–7800)
NEUTROS PCT: 68 %
PLATELETS: 198 10*3/uL (ref 140–400)
RBC: 5.31 MIL/uL (ref 4.20–5.80)
RDW: 14.5 % (ref 11.0–15.0)
WBC: 10 10*3/uL (ref 3.8–10.8)

## 2016-04-27 LAB — TSH: TSH: 2.53 m[IU]/L (ref 0.40–4.50)

## 2016-04-27 MED ORDER — BUPROPION HCL ER (SR) 150 MG PO TB12: ORAL_TABLET | ORAL | 1 refills | Status: DC

## 2016-04-27 MED ORDER — ALPRAZOLAM 0.5 MG PO TABS: 0.5000 mg | ORAL_TABLET | Freq: Every evening | ORAL | 0 refills | Status: DC | PRN
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
## 2016-04-28 LAB — VITAMIN D 25 HYDROXY (VIT D DEFICIENCY, FRACTURES): VIT D 25 HYDROXY: 20 ng/mL — AB (ref 30–100)

## 2016-05-02 ENCOUNTER — Encounter: Payer: Self-pay | Admitting: Family Medicine

## 2016-06-09 ENCOUNTER — Encounter: Payer: Self-pay | Admitting: Physician Assistant

## 2016-06-09 ENCOUNTER — Ambulatory Visit (INDEPENDENT_AMBULATORY_CARE_PROVIDER_SITE_OTHER): Payer: BLUE CROSS/BLUE SHIELD | Admitting: Physician Assistant

## 2016-06-09 ENCOUNTER — Telehealth: Payer: Self-pay

## 2016-06-09 VITALS — BP 150/86 | HR 85 | Temp 97.6°F | Resp 18 | Wt 283.0 lb

## 2016-06-09 DIAGNOSIS — F17200 Nicotine dependence, unspecified, uncomplicated: Secondary | ICD-10-CM

## 2016-06-09 DIAGNOSIS — Z720 Tobacco use: Secondary | ICD-10-CM | POA: Diagnosis not present

## 2016-06-09 DIAGNOSIS — F411 Generalized anxiety disorder: Secondary | ICD-10-CM

## 2016-06-09 DIAGNOSIS — E669 Obesity, unspecified: Secondary | ICD-10-CM

## 2016-06-09 DIAGNOSIS — G4700 Insomnia, unspecified: Secondary | ICD-10-CM

## 2016-06-09 MED ORDER — SERTRALINE HCL 50 MG PO TABS: 50.0000 mg | ORAL_TABLET | Freq: Every day | ORAL | 1 refills | Status: DC
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
## 2016-06-09 NOTE — Telephone Encounter (Signed)
Called pt regarding med that was not sent to pharmacy

## 2016-06-10 ENCOUNTER — Other Ambulatory Visit: Payer: Self-pay

## 2016-06-10 DIAGNOSIS — G4700 Insomnia, unspecified: Secondary | ICD-10-CM

## 2016-06-10 DIAGNOSIS — F411 Generalized anxiety disorder: Secondary | ICD-10-CM

## 2016-06-10 MED ORDER — ALPRAZOLAM 0.5 MG PO TABS: 0.5000 mg | ORAL_TABLET | Freq: Every evening | ORAL | 1 refills | Status: DC | PRN

## 2016-06-10 NOTE — Telephone Encounter (Signed)
RX called in .

## 2016-06-10 NOTE — Telephone Encounter (Signed)
Last OV 06-09-16  Last refill 04-27-16 Ok to refill?

## 2016-06-10 NOTE — Telephone Encounter (Signed)
Refill approved but change Sig to say---Take 1 -2 QHS PRN # [id number] + 1

## 2016-07-21 ENCOUNTER — Ambulatory Visit: Payer: BLUE CROSS/BLUE SHIELD | Admitting: Physician Assistant

## 2016-09-12 ENCOUNTER — Ambulatory Visit: Payer: BLUE CROSS/BLUE SHIELD | Admitting: Physician Assistant

## 2016-09-19 ENCOUNTER — Encounter: Payer: Self-pay | Admitting: Physician Assistant

## 2016-09-19 ENCOUNTER — Ambulatory Visit (INDEPENDENT_AMBULATORY_CARE_PROVIDER_SITE_OTHER): Payer: BLUE CROSS/BLUE SHIELD | Admitting: Physician Assistant

## 2016-09-19 VITALS — BP 160/110 | HR 86 | Temp 98.0°F | Resp 16 | Wt 284.0 lb

## 2016-09-19 DIAGNOSIS — I10 Essential (primary) hypertension: Secondary | ICD-10-CM | POA: Diagnosis not present

## 2016-09-19 DIAGNOSIS — G4700 Insomnia, unspecified: Secondary | ICD-10-CM | POA: Diagnosis not present

## 2016-09-19 DIAGNOSIS — F411 Generalized anxiety disorder: Secondary | ICD-10-CM

## 2016-09-19 DIAGNOSIS — L720 Epidermal cyst: Secondary | ICD-10-CM | POA: Diagnosis not present

## 2016-09-19 DIAGNOSIS — L309 Dermatitis, unspecified: Secondary | ICD-10-CM

## 2016-09-19 MED ORDER — PAROXETINE HCL 20 MG PO TABS: 20.0000 mg | ORAL_TABLET | Freq: Every day | ORAL | 1 refills | Status: DC

## 2016-09-19 MED ORDER — ALPRAZOLAM 1 MG PO TABS: 1.0000 mg | ORAL_TABLET | Freq: Every evening | ORAL | 2 refills | Status: DC | PRN

## 2016-09-19 MED ORDER — AMLODIPINE BESYLATE 5 MG PO TABS: 5.0000 mg | ORAL_TABLET | Freq: Every day | ORAL | 3 refills | Status: DC

## 2016-09-19 MED ORDER — TRIAMCINOLONE ACETONIDE 0.5 % EX CREA
1.0000 | TOPICAL_CREAM | Freq: Three times a day (TID) | CUTANEOUS | 0 refills | Status: DC
Start: 2016-09-19 — End: 2017-03-02
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
## 2016-10-04 ENCOUNTER — Encounter: Payer: Self-pay | Admitting: Physician Assistant

## 2016-10-31 ENCOUNTER — Ambulatory Visit: Payer: BLUE CROSS/BLUE SHIELD | Admitting: Physician Assistant

## 2017-01-09 ENCOUNTER — Ambulatory Visit (INDEPENDENT_AMBULATORY_CARE_PROVIDER_SITE_OTHER): Payer: BLUE CROSS/BLUE SHIELD | Admitting: Physician Assistant

## 2017-01-09 ENCOUNTER — Encounter: Payer: Self-pay | Admitting: Physician Assistant

## 2017-01-09 VITALS — BP 130/90 | HR 75 | Temp 97.8°F | Resp 18 | Wt 285.4 lb

## 2017-01-09 DIAGNOSIS — F17200 Nicotine dependence, unspecified, uncomplicated: Secondary | ICD-10-CM | POA: Diagnosis not present

## 2017-01-09 DIAGNOSIS — F411 Generalized anxiety disorder: Secondary | ICD-10-CM

## 2017-01-09 DIAGNOSIS — I10 Essential (primary) hypertension: Secondary | ICD-10-CM | POA: Diagnosis not present

## 2017-01-09 DIAGNOSIS — G4700 Insomnia, unspecified: Secondary | ICD-10-CM

## 2017-01-09 MED ORDER — ALPRAZOLAM 1 MG PO TABS: 1.0000 mg | ORAL_TABLET | Freq: Every evening | ORAL | 2 refills | Status: DC | PRN

## 2017-01-09 MED ORDER — AMLODIPINE BESYLATE 5 MG PO TABS: 5.0000 mg | ORAL_TABLET | Freq: Every day | ORAL | 3 refills | Status: DC

## 2017-01-09 MED ORDER — PAROXETINE HCL 20 MG PO TABS: 20.0000 mg | ORAL_TABLET | Freq: Every day | ORAL | 1 refills | Status: DC
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
## 2017-01-11 ENCOUNTER — Other Ambulatory Visit: Payer: Self-pay

## 2017-01-11 ENCOUNTER — Telehealth: Payer: Self-pay

## 2017-01-11 DIAGNOSIS — K219 Gastro-esophageal reflux disease without esophagitis: Secondary | ICD-10-CM | POA: Insufficient documentation

## 2017-01-11 MED ORDER — OMEPRAZOLE 20 MG PO CPDR: 20.0000 mg | DELAYED_RELEASE_CAPSULE | Freq: Every day | ORAL | 5 refills | Status: DC | PRN

## 2017-01-11 NOTE — Telephone Encounter (Signed)
RX sent to pharmacy patient is aware  

## 2017-01-11 NOTE — Telephone Encounter (Signed)
Patient was seen in office on 4-9 and states a Rx for heartburn was requested, but nothing was sent in  Patient is requesting something be sent in if possible.

## 2017-01-11 NOTE — Telephone Encounter (Signed)
Yes, he did request this and I forgot--- Add GERD to Problem List.  Omeprazole  1 po QD prn # 30 + 5

## 2017-01-18 ENCOUNTER — Encounter: Payer: Self-pay | Admitting: Family Medicine

## 2017-01-18 ENCOUNTER — Ambulatory Visit (INDEPENDENT_AMBULATORY_CARE_PROVIDER_SITE_OTHER): Payer: BLUE CROSS/BLUE SHIELD | Admitting: Family Medicine

## 2017-01-18 VITALS — BP 138/74 | HR 82 | Temp 98.5°F | Resp 18 | Ht 66.0 in | Wt 288.0 lb

## 2017-01-18 DIAGNOSIS — J029 Acute pharyngitis, unspecified: Secondary | ICD-10-CM

## 2017-01-18 DIAGNOSIS — Z20828 Contact with and (suspected) exposure to other viral communicable diseases: Secondary | ICD-10-CM

## 2017-01-18 DIAGNOSIS — J111 Influenza due to unidentified influenza virus with other respiratory manifestations: Secondary | ICD-10-CM | POA: Diagnosis not present

## 2017-01-18 LAB — STREP GROUP A AG, W/REFLEX TO CULT: STREGTOCOCCUS GROUP A AG SCREEN: NOT DETECTED

## 2017-01-18 MED ORDER — OSELTAMIVIR PHOSPHATE 75 MG PO CAPS: 75.0000 mg | ORAL_CAPSULE | Freq: Two times a day (BID) | ORAL | 0 refills | Status: DC

## 2017-01-18 MED ORDER — GUAIFENESIN-CODEINE 100-10 MG/5ML PO SOLN: 5.0000 mL | Freq: Four times a day (QID) | ORAL | 0 refills | Status: DC | PRN

## 2017-01-18 MED ORDER — FIRST-DUKES MOUTHWASH MT SUSP: OROMUCOSAL | 0 refills | Status: DC

## 2017-01-18 NOTE — Progress Notes (Signed)
   Subjective:    Patient ID: Kyle Mays, male    DOB: 1980/10/23, 36 y.o.   MRN: [id number]  Patient presents for Illness (x3 days- sore throat, productive cough with yellow to green mucus, nasal drainage, fatigue)   Sore throat for past few days, cough and congestion, + flu exposure in quarentine, body aches, fatigue   No aleve or tylneol or OTC medications    Review Of Systems:  GEN- + fatigue, fever, weight loss,weakness, recent illness HEENT- denies eye drainage, change in vision, nasal discharge, CVS- denies chest pain, palpitations RESP- denies SOB, +cough, wheeze ABD- denies N/V, change in stools, abd pain GU- denies dysuria, hematuria, dribbling, incontinence MSK- denies joint pain,+ muscle aches, injury Neuro- denies headache, dizziness, syncope, seizure activity       Objective:    BP 138/74   Pulse 82   Temp 98.5 F (36.9 C) (Oral)   Resp 18   Ht  (1.676 m)   Wt 288 lb (130.6 kg)   SpO2 99%   BMI 46.48 kg/m  GEN- NAD, alert and oriented x3,ill appearing  HEENT- PERRL, EOMI, non injected sclera, pink conjunctiva, MMM, oropharynx injected, no exudate, nares clear Neck- Supple, no + LAD  CVS- RRR, no murmur RESP-CTAB EXT- No edema Pulses- Radial 2+   Strep neg      Assessment & Plan:      Problem List Items Addressed This Visit    None    Visit Diagnoses    Influenza    -  Primary   Treat for Influenza B, tamiflu, robitussin codiene, rest fluids, dukes mouthwash for the sore throat    Relevant Medications   oseltamivir (TAMIFLU) 75 MG capsule   Diphenhyd-Hydrocort-Nystatin (FIRST-DUKES MOUTHWASH) SUSP   Pharyngitis, unspecified etiology       Relevant Orders   STREP GROUP A AG, W/REFLEX TO CULT (Completed)   Exposure to the flu          Note: This dictation was prepared with Dragon dictation along with smaller phrase technology. Any transcriptional errors that result from this process are unintentional.

## 2017-01-18 NOTE — Patient Instructions (Addendum)
Mouthwash  for your throat Take the flu medication Take the cough medicine F/U as needed    Influenza, Adult Influenza, more commonly known as "the flu," is a viral infection that primarily affects the respiratory tract. The respiratory tract includes organs that help you breathe, such as the lungs, nose, and throat. The flu causes many common cold symptoms, as well as a high fever and body aches. The flu spreads easily from person to person (is contagious). Getting a flu shot (influenza vaccination) every year is the best way to prevent influenza. What are the causes? Influenza is caused by a virus. You can catch the virus by:  Breathing in droplets from an infected person's cough or sneeze.  Touching something that was recently contaminated with the virus and then touching your mouth, nose, or eyes. What increases the risk? The following factors may make you more likely to get the flu:  Not cleaning your hands frequently with soap and water or alcohol-based hand sanitizer.  Having close contact with many people during cold and flu season.  Touching your mouth, eyes, or nose without washing or sanitizing your hands first.  Not drinking enough fluids or not eating a healthy diet.  Not getting enough sleep or exercise.  Being under a high amount of stress.  Not getting a yearly (annual) flu shot. You may be at a higher risk of complications from the flu, such as a severe lung infection (pneumonia), if you:  Are over the age of 9.  Are pregnant.  Have a weakened disease-fighting system (immune system). You may have a weakened immune system if you:  Have HIV or AIDS.  Are undergoing chemotherapy.  Aretaking medicines that reduce the activity of (suppress) the immune system.  Have a long-term (chronic) illness, such as heart disease, kidney disease, diabetes, or lung disease.  Have a liver disorder.  Are obese.  Have anemia. What are the signs or symptoms? Symptoms  of this condition typically last 4-10 days and may include:  Fever.  Chills.  Headache, body aches, or muscle aches.  Sore throat.  Cough.  Runny or congested nose.  Chest discomfort and cough.  Poor appetite.  Weakness or tiredness (fatigue).  Dizziness.  Nausea or vomiting. How is this diagnosed? This condition may be diagnosed based on your medical history and a physical exam. Your health care provider may do a nose or throat swab test to confirm the diagnosis. How is this treated? If influenza is detected early, you can be treated with antiviral medicine that can reduce the length of your illness and the severity of your symptoms. This medicine may be given by mouth (orally) or through an IV tube that is inserted in one of your veins. The goal of treatment is to relieve symptoms by taking care of yourself at home. This may include taking over-the-counter medicines, drinking plenty of fluids, and adding humidity to the air in your home. In some cases, influenza goes away on its own. Severe influenza or complications from influenza may be treated in a hospital. Follow these instructions at home:  Take over-the-counter and prescription medicines only as told by your health care provider.  Use a cool mist humidifier to add humidity to the air in your home. This can make breathing easier.  Rest as needed.  Drink enough fluid to keep your urine clear or pale yellow.  Cover your mouth and nose when you cough or sneeze.  Wash your hands with soap and water often, especially  after you cough or sneeze. If soap and water are not available, use hand sanitizer.  Stay home from work or school as told by your health care provider. Unless you are visiting your health care provider, try to avoid leaving home until your fever has been gone for 24 hours without the use of medicine.  Keep all follow-up visits as told by your health care provider. This is important. How is this  prevented?  Getting an annual flu shot is the best way to avoid getting the flu. You may get the flu shot in late summer, fall, or winter. Ask your health care provider when you should get your flu shot.  Wash your hands often or use hand sanitizer often.  Avoid contact with people who are sick during cold and flu season.  Eat a healthy diet, drink plenty of fluids, get enough sleep, and exercise regularly. Contact a health care provider if:  You develop new symptoms.  You have:  Chest pain.  Diarrhea.  A fever.  Your cough gets worse.  You produce more mucus.  You feel nauseous or you vomit. Get help right away if:  You develop shortness of breath or difficulty breathing.  Your skin or nails turn a bluish color.  You have severe pain or stiffness in your neck.  You develop a sudden headache or sudden pain in your face or ear.  You cannot stop vomiting. This information is not intended to replace advice given to you by your health care provider. Make sure you discuss any questions you have with your health care provider. Document Released: 09/16/2000 Document Revised: 02/25/2016 Document Reviewed: 07/14/2015 Elsevier Interactive Patient Education  2017 [employer].

## 2017-01-20 LAB — CULTURE, GROUP A STREP

## 2017-02-15 ENCOUNTER — Ambulatory Visit (INDEPENDENT_AMBULATORY_CARE_PROVIDER_SITE_OTHER): Payer: BLUE CROSS/BLUE SHIELD | Admitting: Physician Assistant

## 2017-02-15 ENCOUNTER — Encounter: Payer: Self-pay | Admitting: Physician Assistant

## 2017-02-15 VITALS — BP 130/92 | HR 63 | Temp 98.1°F | Resp 16 | Wt 296.6 lb

## 2017-02-15 DIAGNOSIS — W57XXXA Bitten or stung by nonvenomous insect and other nonvenomous arthropods, initial encounter: Secondary | ICD-10-CM

## 2017-02-15 DIAGNOSIS — S80861A Insect bite (nonvenomous), right lower leg, initial encounter: Secondary | ICD-10-CM

## 2017-02-15 MED ORDER — DOXYCYCLINE HYCLATE 100 MG PO TABS: 100.0000 mg | ORAL_TABLET | Freq: Two times a day (BID) | ORAL | 0 refills | Status: DC

## 2017-02-15 NOTE — Progress Notes (Signed)
Patient ID: Kyle Mays MRN: [id number], DOB: 05-Dec-1980, 36 y.o. Date of Encounter: 02/15/2017, 1:01 PM    Chief Complaint:  Chief Complaint  Patient presents with  . Tick Removal    on lower right side of back  . Insect Bite    right leg     HPI: 36 y.o. year old male presents with above.   Says that on Monday night which was 02/13/17 he pulled a tick off of his right side of his back. Says that at that time he felt the area on his right leg having a "burning sensation" and when he looked at that area on his right leg he saw to little holes look like to little bite marks. Comes in today to check the area on the medial aspect of the right knee and also the tic site on right back.  Has seen no other type of rash no areas of rash except for just the 2 sites above. Has had no fevers. Has had no headache. No myalgias.     Home Meds:   Outpatient Medications Prior to Visit  Medication Sig Dispense Refill  . ALPRAZolam (XANAX) 1 MG tablet Take 1 tablet (1 mg total) by mouth at bedtime as needed for anxiety. (Patient taking differently: Take 0.5 mg by mouth at bedtime as needed for anxiety. ) 30 tablet 2  . amLODipine (NORVASC) 5 MG tablet Take 1 tablet (5 mg total) by mouth daily. 90 tablet 3  . Diphenhyd-Hydrocort-Nystatin (FIRST-DUKES MOUTHWASH) SUSP Swish and swallow 1 teaspoon four times a day as needed for 1 week 1 Bottle 0  . guaiFENesin-codeine 100-10 MG/5ML syrup Take 5 mLs by mouth every 6 (six) hours as needed. 180 mL 0  . omeprazole (PRILOSEC) 20 MG capsule Take 1 capsule (20 mg total) by mouth daily as needed. 30 capsule 5  . PARoxetine (PAXIL) 20 MG tablet Take 1 tablet (20 mg total) by mouth daily. 90 tablet 1  . triamcinolone cream (KENALOG) 0.5 % Apply 1 application topically 3 (three) times daily. 30 g 0  . oseltamivir (TAMIFLU) 75 MG capsule Take 1 capsule (75 mg total) by mouth 2 (two) times daily. 10 capsule 0   No facility-administered medications prior to  visit.     Allergies: No Known Allergies    Review of Systems: See HPI for pertinent ROS. All other ROS negative.    Physical Exam: Blood pressure (!) 130/92, pulse 63, temperature 98.1 F (36.7 C), temperature source Oral, resp. rate 16, weight 296 lb 9.6 oz (134.5 kg), SpO2 97 %., Body mass index is 47.87 kg/m. General:  Obese Male. Appears in no acute distress. Neck: Supple. No thyromegaly. No lymphadenopathy. Lungs: Clear bilaterally to auscultation without wheezes, rales, or rhonchi. Breathing is unlabored. Heart: Regular rhythm. No murmurs, rubs, or gallops. Msk:  Strength and tone normal for age. Skin:  Medial aspect of right knee: There is ~ 0.5 cm diameter area that is bright red blood. When palpate the skin surrounding this, there is some firmness but no abscess.  Right side of Back: There is ~ 0.5cm area of open/scabbed area--c/w site of tic bite--there is small amount of purulent drainage coming from this site. There is surrounding mild urticaria. No abscess.  Neuro: Alert and oriented X 3. Moves all extremities spontaneously. Gait is normal. CNII-XII grossly in tact. Psych:  Responds to questions appropriately with a normal affect.     ASSESSMENT AND PLAN:  36 y.o. year old male with  1. Tick bite, initial encounter -- Right back 2. ? Tick bite or other insect bite on right knee He is to start the doxycycline immediately and take twice daily. This will cover for any bacterial infection of the skin and also would cover for Lyme and [facility] fever given recent tick bite. He is to also take oral Benadryl and apply Benadryl spray or Benadryl cream to the sites as I think some of the current findings are allergic response. Follow-up if sites worsen or do not resolve.  - doxycycline (VIBRA-TABS) 100 MG tablet; Take 1 tablet (100 mg total) by mouth 2 (two) times daily.  Dispense: 28 tablet; Refill: 0   Signed, [address] Beth Sebewa[place]ixon, [place], [facility] 02/15/2017 1:01  PM

## 2017-03-02 ENCOUNTER — Other Ambulatory Visit: Payer: Self-pay | Admitting: Physician Assistant

## 2017-03-02 DIAGNOSIS — L309 Dermatitis, unspecified: Secondary | ICD-10-CM

## 2017-03-03 NOTE — Telephone Encounter (Signed)
Refill appropriate 

## 2017-04-02 ENCOUNTER — Other Ambulatory Visit: Payer: Self-pay | Admitting: Physician Assistant

## 2017-04-02 DIAGNOSIS — G4700 Insomnia, unspecified: Secondary | ICD-10-CM

## 2017-04-02 DIAGNOSIS — F411 Generalized anxiety disorder: Secondary | ICD-10-CM

## 2017-04-03 NOTE — Telephone Encounter (Signed)
Rx called in 

## 2017-04-03 NOTE — Telephone Encounter (Signed)
Last OV 4/9 Last refill 4/9 Ok to refill?

## 2017-04-03 NOTE — Telephone Encounter (Signed)
Approved. # [phone] + 0. 

## 2017-04-18 ENCOUNTER — Ambulatory Visit (INDEPENDENT_AMBULATORY_CARE_PROVIDER_SITE_OTHER): Payer: BLUE CROSS/BLUE SHIELD | Admitting: Family Medicine

## 2017-04-18 ENCOUNTER — Encounter: Payer: Self-pay | Admitting: Family Medicine

## 2017-04-18 VITALS — BP 144/80 | HR 82 | Temp 98.3°F | Resp 14 | Ht 66.0 in | Wt 298.0 lb

## 2017-04-18 DIAGNOSIS — I10 Essential (primary) hypertension: Secondary | ICD-10-CM

## 2017-04-18 DIAGNOSIS — G4700 Insomnia, unspecified: Secondary | ICD-10-CM | POA: Diagnosis not present

## 2017-04-18 DIAGNOSIS — R632 Polyphagia: Secondary | ICD-10-CM | POA: Diagnosis not present

## 2017-04-18 DIAGNOSIS — G4730 Sleep apnea, unspecified: Secondary | ICD-10-CM

## 2017-04-18 DIAGNOSIS — R5383 Other fatigue: Secondary | ICD-10-CM

## 2017-04-18 MED ORDER — TRAZODONE HCL 50 MG PO TABS: 25.0000 mg | ORAL_TABLET | Freq: Every evening | ORAL | 3 refills | Status: DC | PRN

## 2017-04-18 NOTE — Progress Notes (Signed)
Subjective:    Patient ID: Kyle Mays, male    DOB: [birth year], 36 y.o.   MRN: [id number]  Patient presents for Sleeping Issues (states that he has issues with staying awake during day- states that he does not feel rested when he wakes up)  Patient here with sleeping issues. For many months of this is worse in the past 2 months he has had problems with fatigue following asleep during the day. He will not off either when he is driving. He works in [employer] and states often his room mates will tell him that he stops breathing in his sleep or starts snoring very loudly. He now feels fatigued all the times that he never felt like this before. He denies any chest pain or shortness of breath has not had any recent illness. He has had his weight is a problem also has been trying to work on dietary changes to lose weight but having difficulty.  Often he cannot fall asleep at nighttime eating that he takes Xanax and he also takes other over-the-counter medication for sleep. He states he's always had difficulty with sleep in general. He feels like his Paxil doing well with regards to his anxiety and he does take his blood pressure medicine on a regular basis.  Review Of Systems:  GEN- denies fatigue, fever, weight loss,weakness, recent illness HEENT- denies eye drainage, change in vision, nasal discharge, CVS- denies chest pain, palpitations RESP- denies SOB, cough, wheeze ABD- denies N/V, change in stools, abd pain GU- denies dysuria, hematuria, dribbling, incontinence MSK- denies joint pain, muscle aches, injury Neuro- denies headache, dizziness, syncope, seizure activity       Objective:    BP (!) 144/80   Pulse 82   Temp 98.3 F (36.8 C) (Oral)   Resp 14   Ht 5\' 6"  (1.676 m)   Wt 298 lb (135.2 kg)   SpO2 97%   BMI 48.10 kg/m  GEN- NAD, alert and oriented x3,obese ,large neck circumference  HEENT- PERRL, EOMI, non injected sclera, pink conjunctiva, MMM, oropharynx clear Neck-  Supple, no thyromegaly CVS- RRR, no murmur RESP-CTAB Neuro-CNII-XII in tact no focal deficits  Psych- normal affect and mood  EXT- No edema Pulses- Radial 2+  Epworth sleepiness scale- 14       Assessment & Plan:      Problem List Items Addressed This Visit    Obesity   Sleep apnea    Is morbidly obese artery has hypertension. I think that he is having sleep apnea. He needs official sleep study is likely going to need CPAP therapy. Significant weight loss would also be beneficial. I think it is more fatigued from not sleeping well at night causing his daytime sleepiness signature narcolepsy but the sleep study will help me differentiate this as well. With regards to his [facility] I am concerned about underlying metabolic issues such as diabetes and attains of labs today. We'll also start to work diligently on his weight loss which she is on board with.      Insomnia    D/c OTC meds, trial of trazodone, continue paxil, hold xanax as well       Essential hypertension - Primary    BP a little elevated today in setting of obesity, he did rush in from work No change to meds today, evaluating other contributing problems      Relevant Orders   CBC with Differential/Platelet (Completed)   Comprehensive metabolic panel (Completed)   TSH (Completed)  Other Visit Diagnoses    Polyphagia       Relevant Orders   Hemoglobin A1c (Completed)   TSH (Completed)   Morbid obesity (HCC)       Relevant Orders   Hemoglobin A1c (Completed)   Other fatigue       Relevant Orders   TSH (Completed)      Note: This dictation was prepared with Dragon dictation along with smaller phrase technology. Any transcriptional errors that result from this process are unintentional.

## 2017-04-18 NOTE — Patient Instructions (Signed)
Sleep study to be done We will call with labs Try trazodone for sleep F/U pending results

## 2017-04-19 LAB — COMPREHENSIVE METABOLIC PANEL
ALBUMIN: 4.2 g/dL (ref 3.6–5.1)
ALT: 42 U/L (ref 9–46)
AST: 26 U/L (ref 10–40)
Alkaline Phosphatase: 67 U/L (ref 40–115)
BUN: 11 mg/dL (ref 7–25)
CO2: 20 mmol/L (ref 20–31)
CREATININE: 1.01 mg/dL (ref 0.60–1.35)
Calcium: 9.3 mg/dL (ref 8.6–10.3)
Chloride: 105 mmol/L (ref 98–110)
Glucose, Bld: 93 mg/dL (ref 70–99)
Potassium: 4.1 mmol/L (ref 3.5–5.3)
SODIUM: 137 mmol/L (ref 135–146)
Total Bilirubin: 0.4 mg/dL (ref 0.2–1.2)
Total Protein: 7.4 g/dL (ref 6.1–8.1)

## 2017-04-19 LAB — CBC WITH DIFFERENTIAL/PLATELET
BASOS ABS: 0 {cells}/uL (ref 0–200)
Basophils Relative: 0 %
EOS PCT: 2 %
Eosinophils Absolute: 202 cells/uL (ref 15–500)
HCT: 46.7 % (ref 38.5–50.0)
HEMOGLOBIN: 15.6 g/dL (ref 13.0–17.0)
Lymphocytes Relative: 28 %
Lymphs Abs: 2828 cells/uL (ref 850–3900)
MCH: 29.2 pg (ref 27.0–33.0)
MCHC: 33.4 g/dL (ref 32.0–36.0)
MCV: 87.5 fL (ref 80.0–100.0)
MONOS PCT: 5 %
MPV: 12.4 fL (ref 7.5–12.5)
Monocytes Absolute: 505 cells/uL (ref 200–950)
NEUTROS PCT: 65 %
Neutro Abs: 6565 cells/uL (ref 1500–7800)
Platelets: 225 10*3/uL (ref 140–400)
RBC: 5.34 MIL/uL (ref 4.20–5.80)
RDW: 13.9 % (ref 11.0–15.0)
WBC: 10.1 10*3/uL (ref 3.8–10.8)

## 2017-04-19 LAB — HEMOGLOBIN A1C
Hgb A1c MFr Bld: 5.5 % (ref <5.7)
MEAN PLASMA GLUCOSE: 111 mg/dL

## 2017-04-19 LAB — TSH: TSH: 1.95 m[IU]/L (ref 0.40–4.50)

## 2017-04-20 ENCOUNTER — Encounter: Payer: Self-pay | Admitting: Family Medicine

## 2017-04-20 NOTE — Assessment & Plan Note (Signed)
D/c OTC meds, trial of trazodone, continue paxil, hold xanax as well

## 2017-04-20 NOTE — Assessment & Plan Note (Signed)
BP a little elevated today in setting of obesity, he did rush in from work No change to meds today, evaluating other contributing problems

## 2017-04-20 NOTE — Assessment & Plan Note (Signed)
Is morbidly obese artery has hypertension. I think that he is having sleep apnea. He needs official sleep study is likely going to need CPAP therapy. Significant weight loss would also be beneficial. I think it is more fatigued from not sleeping well at night causing his daytime sleepiness signature narcolepsy but the sleep study will help me differentiate this as well. [facility] I am concerned about underlying metabolic issues such as diabetes and attains of labs today. We'll also start to work diligently on his weight loss which she is on board with.

## 2017-05-17 ENCOUNTER — Other Ambulatory Visit: Payer: Self-pay | Admitting: Physician Assistant

## 2017-05-17 DIAGNOSIS — F411 Generalized anxiety disorder: Secondary | ICD-10-CM

## 2017-05-17 DIAGNOSIS — G4700 Insomnia, unspecified: Secondary | ICD-10-CM

## 2017-05-17 NOTE — Telephone Encounter (Signed)
Reviewed his office visit note with Dr. Jeanice Limurham 04/18/17.  At that visit she ordered a sleep study and told him to use trazodone for sleep and to stop using the Xanax.  I know that with patient's work schedule it is difficult for him to come here and that's probably why he has not had sleep study yet .  Nonetheless it looks like sleep study is ordered but has not been performed. Find out if he is using the trazodone for sleep. Find out when he will be in town to come in for follow-up office visit.

## 2017-05-17 NOTE — Telephone Encounter (Signed)
Called patient VM was full, will try again later

## 2017-05-17 NOTE — Telephone Encounter (Signed)
Ok to refill 

## 2017-05-22 NOTE — Telephone Encounter (Signed)
Tried calling patient mailbox is full

## 2017-06-07 ENCOUNTER — Ambulatory Visit: Payer: BLUE CROSS/BLUE SHIELD | Attending: Family Medicine | Admitting: Neurology

## 2017-06-07 DIAGNOSIS — G4733 Obstructive sleep apnea (adult) (pediatric): Secondary | ICD-10-CM | POA: Insufficient documentation

## 2017-06-07 DIAGNOSIS — G4730 Sleep apnea, unspecified: Secondary | ICD-10-CM | POA: Diagnosis present

## 2017-06-10 NOTE — Procedures (Signed)
   [facility] Kyle Roma A. Gerilyn Pilgrimoonquah, MD     [email or web address]             NOCTURNAL POLYSOMNOGRAPHY   LOCATION: [facility]  Patient Name: Kyle Mays, Kyle Mays Study Date: 06/07/2017 Gender: Male D.O.B: Jul 01, 1981 Age (years): 36 Referring Provider: Salley ScarletKawanta F [place] Height (inches): 66 Interpreting Physician: Beryle BeamsKofi Jadaya Sommerfield MD, ABSM Weight (lbs): 298 RPSGT: Peak, Robert BMI: 48 MRN: [id number] Neck Size: 20.00 CLINICAL INFORMATION Sleep Study Type: NPSG  Indication for sleep study: N/A  Epworth Sleepiness Score: 16  SLEEP STUDY TECHNIQUE As per the AASM Manual for the Scoring of Sleep and Associated Events v2.3 (April 2016) with a hypopnea requiring 4% desaturations.  The channels recorded and monitored were frontal, central and occipital EEG, electrooculogram (EOG), submentalis EMG (chin), nasal and oral airflow, thoracic and abdominal wall motion, anterior tibialis EMG, snore microphone, electrocardiogram, and pulse oximetry.  MEDICATIONS Medications self-administered by patient taken the night of the study : N/A  Current Outpatient Prescriptions:  .  ALPRAZolam (XANAX) 1 MG tablet, TAKE 1 TABLET BY MOUTH EVERY NIGHT AT BEDTIME AS NEEDED FOR ANXIETY, Disp: 30 tablet, Rfl: 0 .  amLODipine (NORVASC) 5 MG tablet, Take 1 tablet (5 mg total) by mouth daily., Disp: 90 tablet, Rfl: 3 .  omeprazole (PRILOSEC) 20 MG capsule, Take 1 capsule (20 mg total) by mouth daily as needed., Disp: 30 capsule, Rfl: 5 .  PARoxetine (PAXIL) 20 MG tablet, Take 1 tablet (20 mg total) by mouth daily., Disp: 90 tablet, Rfl: 1 .  traZODone (DESYREL) 50 MG tablet, Take 0.5-1 tablets (25-50 mg total) by mouth at bedtime as needed for sleep., Disp: 30 tablet, Rfl: 3 .  triamcinolone cream (KENALOG) 0.5 %, APPLY EXTERNALLY TO THE AFFECTED AREA THREE TIMES DAILY, Disp: 30 g, Rfl: 2   SLEEP ARCHITECTURE The study was initiated at 9:24:57 PM and ended at 4:01:09 AM.  Sleep onset time was 16.2  minutes and the sleep efficiency was 83.5%. The total sleep time was 331.0 minutes.  Stage REM latency was N/A minutes.  The patient spent 20.85% of the night in stage N1 sleep, 79.15% in stage N2 sleep, 0.00% in stage N3 and 0.00% in REM.  Alpha intrusion was absent.  Supine sleep was 33.88%.  RESPIRATORY PARAMETERS The overall apnea/hypopnea index (AHI) was 16.9 per hour. There were 5 total apneas, including 5 obstructive, 0 central and 0 mixed apneas. There were 88 hypopneas and 69 RERAs.  The AHI during Stage REM sleep was N/A per hour.  AHI while supine was 19.8 per hour.  The mean oxygen saturation was 89.72%. The minimum SpO2 during sleep was 83.00%.  Loud snoring was noted during this study.  CARDIAC DATA The 2 lead EKG demonstrated sinus rhythm. The mean heart rate was 76.30 beats per minute. Other EKG findings include: None. LEG MOVEMENT DATA The total PLMS were 0 with a resulting PLMS index of 0.00. Associated arousal with leg movement index was 0.0.  IMPRESSIONS - Moderate obstructive sleep apnea is documented during this study. AutoPap 10-20 is recommended. - Abnormal sleep architecture with absent slow-wave sleep and absent REM sleep is observed.   Argie RammingKofi A Coulter Oldaker, MD Diplomate, American Board of Sleep Medicine.   ELECTRONICALLY SIGNED ON:  06/10/2017, 2:18 PM  [facility] PH: [phone]   FX: [phone] ACCREDITED BY THE AMERICAN ACADEMY OF SLEEP MEDICINE

## 2017-06-12 NOTE — Progress Notes (Signed)
Call pt he has OSA, needs CPAP, [facility]

## 2017-07-06 ENCOUNTER — Other Ambulatory Visit: Payer: Self-pay | Admitting: Physician Assistant

## 2017-07-06 DIAGNOSIS — F411 Generalized anxiety disorder: Secondary | ICD-10-CM

## 2017-07-12 ENCOUNTER — Ambulatory Visit: Payer: BLUE CROSS/BLUE SHIELD | Admitting: Physician Assistant

## 2017-07-27 ENCOUNTER — Telehealth: Payer: Self-pay

## 2017-07-27 NOTE — Telephone Encounter (Signed)
[facility] and his cpap machine was ordered on 06/12/2017 by Dr Jeanice Limurham patient started using the machine 06/30/2017 and has a follow up appointment with M. Dixon-PA  on 09/14/2017

## 2017-08-01 ENCOUNTER — Ambulatory Visit (INDEPENDENT_AMBULATORY_CARE_PROVIDER_SITE_OTHER): Payer: BLUE CROSS/BLUE SHIELD | Admitting: Family Medicine

## 2017-08-01 ENCOUNTER — Encounter: Payer: Self-pay | Admitting: Family Medicine

## 2017-08-01 VITALS — BP 142/88 | HR 82 | Temp 99.3°F | Resp 18 | Ht 66.0 in | Wt 309.0 lb

## 2017-08-01 DIAGNOSIS — J069 Acute upper respiratory infection, unspecified: Secondary | ICD-10-CM

## 2017-08-01 LAB — INFLUENZA A AND B AG, IMMUNOASSAY
INFLUENZA A ANTIGEN: NOT DETECTED
INFLUENZA B ANTIGEN: NOT DETECTED

## 2017-08-01 MED ORDER — GUAIFENESIN-CODEINE 100-10 MG/5ML PO SOLN: 5.0000 mL | Freq: Four times a day (QID) | ORAL | 0 refills | Status: DC | PRN

## 2017-08-01 NOTE — Progress Notes (Signed)
   Subjective:    Patient ID: Kyle Mays, male    DOB: [birth year], 36 y.o.   MRN: [id number]  Patient presents for Flu Like Symptoms (x3 days- productive cough with greenish brown colored mucus, weakness, malaise)  Pt here with cough with production, body aches, started yesterday. Cough green sputum. Wife was sick 1 week ago. Had diarrhea yesterday 3 times, no blood in stool, no abdominal pain, No N/V.  Has been congested all night with cough, unable to tolerate CPAP machine No known fever, no chills     Review Of Systems:  GEN- +fatigue, fever, weight loss,weakness, recent illness HEENT- denies eye drainage, change in vision, +nasal discharge, CVS- denies chest pain, palpitations RESP- denies SOB, +cough, wheeze ABD- denies N/V, change in stools, abd pain GU- denies dysuria, hematuria, dribbling, incontinence MSK- denies joint pain, muscle aches, injury Neuro- denies headache, dizziness, syncope, seizure activity       Objective:    BP (!) 142/88   Pulse 82   Temp 99.3 F (37.4 C) (Oral)   Resp 18   Ht 5\' 6"  (1.676 m)   Wt (!) 309 lb (140.2 kg)   SpO2 96%   BMI 49.87 kg/m  GEN- NAD, alert and oriented x3,obese , sick appearing  HEENT- PERRL, EOMI, non injected sclera, pink conjunctiva, MMM, oropharynx clear, nares clear rhinorrhea, TM clear bilat, no effusion, no maxillary, sinus tenderness  Neck- Supple, no LAD CVS- RRR, no murmur RESP-CTAB ABD-NABS,soft,NT,ND EXT- No edema Pulses- Radial 2+        Assessment & Plan:      Problem List Items Addressed This Visit    None    Visit Diagnoses    Viral URI    -  Primary   Viral illness, influenza Neg, no signifcant fever , viral illness with wife last week. Treat symptoms, given xyzal for 10 days, nasacort, robitussin codiene, d/c nyquil with the decongestant causing BP to go up. He works on [employer], given 2 days off of work    Relevant Orders   Influenza A and B Ag, Immunoassay (Completed)      Note: This dictation was prepared with Nurse, children'sDragon dictation along with smaller Lobbyistphrase technology. Any transcriptional errors that result from this process are unintentional.

## 2017-08-01 NOTE — Patient Instructions (Addendum)
Take cough medicine Nasal spray Allergy medication Fluids, rest Call if not improved by Friday   Upper Respiratory Infection, Adult Most upper respiratory infections (URIs) are a viral infection of the air passages leading to the lungs. A URI affects the nose, throat, and upper air passages. The most common type of URI is nasopharyngitis and is typically referred to as "the common cold." URIs run their course and usually go away on their own. Most of the time, a URI does not require medical attention, but sometimes a bacterial infection in the upper airways can follow a viral infection. This is called a secondary infection. Sinus and middle ear infections are common types of secondary upper respiratory infections. Bacterial pneumonia can also complicate a URI. A URI can worsen asthma and chronic obstructive pulmonary disease (COPD). Sometimes, these complications can require emergency medical care and may be life threatening. What are the causes? Almost all URIs are caused by viruses. A virus is a type of germ and can spread from one person to another. What increases the risk? You may be at risk for a URI if:  You smoke.  You have chronic heart or lung disease.  You have a weakened defense (immune) system.  You are very young or very old.  You have nasal allergies or asthma.  You work in crowded or poorly ventilated areas.  You work in health care facilities or schools.  What are the signs or symptoms? Symptoms typically develop 2-3 days after you come in contact with a cold virus. Most viral URIs last 7-10 days. However, viral URIs from the influenza virus (flu virus) can last 14-18 days and are typically more severe. Symptoms may include:  Runny or stuffy (congested) nose.  Sneezing.  Cough.  Sore throat.  Headache.  Fatigue.  Fever.  Loss of appetite.  Pain in your forehead, behind your eyes, and over your cheekbones (sinus pain).  Muscle aches.  How is this  diagnosed? Your health care provider may diagnose a URI by:  Physical exam.  Tests to check that your symptoms are not due to another condition such as: ? Strep throat. ? Sinusitis. ? Pneumonia. ? Asthma.  How is this treated? A URI goes away on its own with time. It cannot be cured with medicines, but medicines may be prescribed or recommended to relieve symptoms. Medicines may help:  Reduce your fever.  Reduce your cough.  Relieve nasal congestion.  Follow these instructions at home:  Take medicines only as directed by your health care provider.  Gargle warm saltwater or take cough drops to comfort your throat as directed by your health care provider.  Use a warm mist humidifier or inhale steam from a shower to increase air moisture. This may make it easier to breathe.  Drink enough fluid to keep your urine clear or pale yellow.  Eat soups and other clear broths and maintain good nutrition.  Rest as needed.  Return to work when your temperature has returned to normal or as your health care provider advises. You may need to stay home longer to avoid infecting others. You can also use a face mask and careful hand washing to prevent spread of the virus.  Increase the usage of your inhaler if you have asthma.  Do not use any tobacco products, including cigarettes, chewing tobacco, or electronic cigarettes. If you need help quitting, ask your health care provider. How is this prevented? The best way to protect yourself from getting a cold is to  practice good hygiene.  Avoid oral or hand contact with people with cold symptoms.  Wash your hands often if contact occurs.  There is no clear evidence that vitamin C, vitamin E, echinacea, or exercise reduces the chance of developing a cold. However, it is always recommended to get plenty of rest, exercise, and practice good nutrition. Contact a health care provider if:  You are getting worse rather than better.  Your symptoms  are not controlled by medicine.  You have chills.  You have worsening shortness of breath.  You have brown or red mucus.  You have yellow or brown nasal discharge.  You have pain in your face, especially when you bend forward.  You have a fever.  You have swollen neck glands.  You have pain while swallowing.  You have white areas in the back of your throat. Get help right away if:  You have severe or persistent: ? Headache. ? Ear pain. ? Sinus pain. ? Chest pain.  You have chronic lung disease and any of the following: ? Wheezing. ? Prolonged cough. ? Coughing up blood. ? A change in your usual mucus.  You have a stiff neck.  You have changes in your: ? Vision. ? Hearing. ? Thinking. ? Mood. This information is not intended to replace advice given to you by your health care provider. Make sure you discuss any questions you have with your health care provider. Document Released: 03/15/2001 Document Revised: 05/22/2016 Document Reviewed: 12/25/2013 Elsevier Interactive Patient Education  2017 [employer].

## 2017-08-03 ENCOUNTER — Ambulatory Visit: Payer: BLUE CROSS/BLUE SHIELD | Admitting: Physician Assistant

## 2017-08-04 ENCOUNTER — Telehealth: Payer: Self-pay | Admitting: *Deleted

## 2017-08-04 ENCOUNTER — Other Ambulatory Visit: Payer: Self-pay | Admitting: Physician Assistant

## 2017-08-04 NOTE — Telephone Encounter (Signed)
Received call from patient wife.   Reports that patient has continued to be ill and did not return to work today as planned.   Requested note for 08/04/2017.  MD please advise.

## 2017-08-04 NOTE — Telephone Encounter (Signed)
Okay to extend the note , he can return on Monday

## 2017-08-07 NOTE — Telephone Encounter (Signed)
Note transcribed.   Patient made aware to come to office to pick up per VM.

## 2017-09-14 ENCOUNTER — Other Ambulatory Visit: Payer: Self-pay

## 2017-09-14 ENCOUNTER — Ambulatory Visit: Payer: BLUE CROSS/BLUE SHIELD | Admitting: Physician Assistant

## 2017-09-14 ENCOUNTER — Encounter: Payer: Self-pay | Admitting: Physician Assistant

## 2017-09-14 VITALS — BP 142/98 | HR 93 | Temp 97.6°F | Resp 18 | Ht 66.0 in | Wt 296.0 lb

## 2017-09-14 DIAGNOSIS — L918 Other hypertrophic disorders of the skin: Secondary | ICD-10-CM

## 2017-09-14 DIAGNOSIS — M26622 Arthralgia of left temporomandibular joint: Secondary | ICD-10-CM

## 2017-09-14 DIAGNOSIS — G4733 Obstructive sleep apnea (adult) (pediatric): Secondary | ICD-10-CM | POA: Diagnosis not present

## 2017-09-14 DIAGNOSIS — M545 Low back pain, unspecified: Secondary | ICD-10-CM
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
## 2017-10-02 ENCOUNTER — Other Ambulatory Visit: Payer: Self-pay | Admitting: Physician Assistant

## 2017-10-02 DIAGNOSIS — F411 Generalized anxiety disorder: Secondary | ICD-10-CM

## 2017-10-12 ENCOUNTER — Telehealth: Payer: Self-pay

## 2017-10-12 MED ORDER — ALPRAZOLAM 1 MG PO TABS: 1.0000 mg | ORAL_TABLET | Freq: Every day | ORAL | 2 refills | Status: DC

## 2017-10-12 NOTE — Telephone Encounter (Signed)
I reviewed his chart.  In the past his Xanax was increased to 1 mg.   01/09/17 he was on Xanax 1 mg.   At his visit 04/2017 this was changed to trazodone. Can go back to Xanax 1 mg 1 p.o. nightly #30+2 refills.

## 2017-10-12 NOTE — Telephone Encounter (Signed)
rx called in patient wife is aware

## 2017-10-12 NOTE — Telephone Encounter (Signed)
Patient wife called and states patient trazodone is no longer working for him and would like to go back on xanax. Pls advise

## 2017-10-19 ENCOUNTER — Ambulatory Visit: Payer: Self-pay | Admitting: Family Medicine

## 2017-10-19 ENCOUNTER — Other Ambulatory Visit: Payer: Self-pay

## 2017-10-19 ENCOUNTER — Encounter: Payer: Self-pay | Admitting: Physician Assistant

## 2017-10-19 ENCOUNTER — Ambulatory Visit: Payer: BLUE CROSS/BLUE SHIELD | Admitting: Physician Assistant

## 2017-10-19 VITALS — BP 136/98 | HR 96 | Temp 98.0°F | Resp 18 | Wt 290.0 lb

## 2017-10-19 VITALS — BP 135/90 | HR 99 | Temp 97.4°F | Resp 18 | Wt 290.4 lb

## 2017-10-19 DIAGNOSIS — B349 Viral infection, unspecified: Secondary | ICD-10-CM

## 2017-10-19 DIAGNOSIS — E860 Dehydration: Secondary | ICD-10-CM | POA: Diagnosis not present

## 2017-10-19 DIAGNOSIS — R197 Diarrhea, unspecified: Secondary | ICD-10-CM

## 2017-10-19 DIAGNOSIS — R110 Nausea: Secondary | ICD-10-CM | POA: Diagnosis not present

## 2017-10-19 LAB — CBC
HCT: 48.9 % (ref 38.5–50.0)
HEMOGLOBIN: 16.4 g/dL (ref 13.2–17.1)
MCH: 29.3 pg (ref 27.0–33.0)
MCHC: 33.5 g/dL (ref 32.0–36.0)
MCV: 87.3 fL (ref 80.0–100.0)
PLATELETS: 190 10*3/uL (ref 140–400)
RBC: 5.6 10*6/uL (ref 4.20–5.80)
RDW: 14.3 % (ref 11.0–15.0)
WBC: 10.8 10*3/uL (ref 3.8–10.8)

## 2017-10-19 LAB — COMPLETE METABOLIC PANEL WITH GFR
AG RATIO: 1.4 (calc) (ref 1.0–2.5)
ALBUMIN MSPROF: 4.1 g/dL (ref 3.6–5.1)
ALT: 22 U/L (ref 9–46)
AST: 19 U/L (ref 10–40)
Alkaline phosphatase (APISO): 70 U/L (ref 40–115)
BILIRUBIN TOTAL: 0.6 mg/dL (ref 0.2–1.2)
BUN: 10 mg/dL (ref 7–25)
CALCIUM: 9 mg/dL (ref 8.6–10.3)
CO2: 25 mmol/L (ref 20–32)
Chloride: 99 mmol/L (ref 98–110)
Creat: 0.9 mg/dL (ref 0.60–1.35)
GFR, EST AFRICAN AMERICAN: 128 mL/min/{1.73_m2} (ref >=60)
GFR, EST NON AFRICAN AMERICAN: 110 mL/min/{1.73_m2} (ref >=60)
GLUCOSE: 103 mg/dL — AB (ref 65–99)
Globulin: 3 g/dL (calc) (ref 1.9–3.7)
Potassium: 3.9 mmol/L (ref 3.5–5.3)
Sodium: 134 mmol/L — ABNORMAL LOW (ref 135–146)
Total Protein: 7.1 g/dL (ref 6.1–8.1)

## 2017-10-19 LAB — POCT INFLUENZA A/B
Influenza A, POC: NEGATIVE
Influenza B, POC: NEGATIVE

## 2017-10-19 LAB — EXTRA LAV TOP TUBE

## 2017-10-19 MED ORDER — PROMETHAZINE HCL 25 MG/ML IJ SOLN
25.0000 mg | Freq: Once | INTRAMUSCULAR | Status: AC
  Administered 2017-10-19: 25 mg via INTRAVENOUS

## 2017-10-19 MED ORDER — ACETAMINOPHEN ER 650 MG PO TBCR: 650.0000 mg | EXTENDED_RELEASE_TABLET | Freq: Three times a day (TID) | ORAL | 1 refills | Status: DC | PRN

## 2017-10-19 MED ORDER — DIPHENOXYLATE-ATROPINE 2.5-0.025 MG PO TABS: 1.0000 | ORAL_TABLET | Freq: Four times a day (QID) | ORAL | 0 refills | Status: DC | PRN

## 2017-10-19 MED ORDER — PROMETHAZINE HCL 25 MG PO TABS: 25.0000 mg | ORAL_TABLET | Freq: Four times a day (QID) | ORAL | 0 refills | Status: DC | PRN

## 2017-10-19 NOTE — Progress Notes (Signed)
Subjective:  Kyle Mays is a 37 y.o. male with history of hypertension, obstructive sleep apnea, obesity, and current smoker, presents for evaluation of possible flu-like symptoms.  Symptoms include achiness, cough described as nonproductive, chills, fever: suspected fevers but not measured at home, nausea without vomiting, watery and bloody diarrhea, night sweats and pain while swallowing.  He has had frequent eOnset of symptoms was 3 days ago, and has been gradually worsening since that time.  Treatment to date:  ibuprofen, rest and anit-diarrheal.  High risk factors for influenza complications:  co-morbid illness and current smoker.  The following portions of the patient's history were reviewed and updated as appropriate:  allergies, current medications and past medical history.  Today's Vitals   10/19/17 0826 10/19/17 0905  BP: 135/90   Pulse: 99   Resp: 18   Temp: (!) 97.4 F (36.3 C)   TempSrc: Oral   SpO2: (!) 88% 94%  Weight: 290 lb 6.4 oz (131.7 kg)    Constitutional: positive for chills, fatigue, fevers, night sweats and bodyaches  Eyes: negative Ears, nose, mouth, throat, and face: positive for sore throat Respiratory: negative Cardiovascular: negative Gastrointestinal: positive for diarrhea, melena and nausea Musculoskeletal:positive for arthralgias Objective:  General appearance: alert, cooperative, mild distress, morbidly obese and pale Eyes: conjunctivae/corneas clear. PERRL, EOM's intact. Fundi benign. Ears: normal TM's and external ear canals both ears Nose: Nares normal. Septum midline. Mucosa normal. No drainage or sinus tenderness. Throat: normal findings: tongue midline and normal and erythematous Lungs: clear to auscultation bilaterally Abdomen: abnormal findings: decreases bowel sounds, mildly distended  Pulses: 2+ and symmetric Skin: Skin color, texture, turgor normal. No rashes or lesions or pale and dry    Assessment:  Viral Illness and Dehydration     Influenza Negative. Current symptomatology is consistent of a viral illness. Patients appearance is ill and skin pale, likely secondary to dehydration. Concern that stool is watery and containing visible blood. ?Enteric infection.  Plan:  -Will treat diarrhea, sore throat, and body aches symptomatically. -Urged patient to follow-up with PCP as this office is not equipped to obtain labs, imaging, or stool samples. -Explained if symptoms worsened before he could see PCP, go to the ED or nearest urgent care.  You appear to be very dehydrated, I recommend that you force fluids all day and avoid beverages c ontaining caffeine. Discontinue taking ibuprofen until the source of blood in stool can be identified. Ibuprofen, Naproxen, or Aspirin will only worsen bleeding. Take tylenol 650 mg every 8 hours as needed for body aches AND sore throat.    Kyle PickKimberly S. Tiburcio PeaHarris, MSN, FNP-C [address]  [place], [place] [id number] [phone]
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
## 2017-10-19 NOTE — Patient Instructions (Addendum)
Please follow-up with your PCP to evaluate your electrolyte status and as well as "blood in stool".  You appear to be very dehydrated, I recommend that you force fluids all day and avoid beverages containing caffeine.  Discontinue taking ibuprofen until the source of blood in stool can be identified. Ibuprofen, Naproxen, or Aspirin will only worsen bleeding.  Take tylenol 650 mg every 8 hours as needed for body aches AND sore throat.  If you are unable to see your PCP and symptoms worsen, go immediately to the ED or closet urgent care.     Viral Illness, Adult Viruses are tiny germs that can get into a person's body and cause illness. There are many different types of viruses, and they cause many types of illness. Viral illnesses can range from mild to severe. They can affect various parts of the body. Common illnesses that are caused by a virus include colds and the flu. Viral illnesses also include serious conditions such as HIV/AIDS (human immunodeficiency virus/acquired immunodeficiency syndrome). A few viruses have been linked to certain cancers. What are the causes? Many types of viruses can cause illness. Viruses invade cells in your body, multiply, and cause the infected cells to malfunction or die. When the cell dies, it releases more of the virus. When this happens, you develop symptoms of the illness, and the virus continues to spread to other cells. If the virus takes over the function of the cell, it can cause the cell to divide and grow out of control, as is the case when a virus causes cancer. Different viruses get into the body in different ways. You can get a virus by:  Swallowing food or water that is contaminated with the virus.  Breathing in droplets that have been coughed or sneezed into the air by an infected person.  Touching a surface that has been contaminated with the virus and then touching your eyes, nose, or mouth.  Being bitten by an insect or animal that  carries the virus.  Having sexual contact with a person who is infected with the virus.  Being exposed to blood or fluids that contain the virus, either through an open cut or during a transfusion.  If a virus enters your body, your body's defense system (immune system) will try to fight the virus. You may be at higher risk for a viral illness if your immune system is weak. What are the signs or symptoms? Symptoms vary depending on the type of virus and the location of the cells that it invades. Common symptoms of the main types of viral illnesses include: Cold and flu viruses  Fever.  Headache.  Sore throat.  Muscle aches.  Nasal congestion.  Cough. Digestive system (gastrointestinal) viruses  Fever.  Abdominal pain.  Nausea.  Diarrhea. Liver viruses (hepatitis)  Loss of appetite.  Tiredness.  Yellowing of the skin (jaundice). Brain and spinal cord viruses  Fever.  Headache.  Stiff neck.  Nausea and vomiting.  Confusion or sleepiness. Skin viruses  Warts.  Itching.  Rash. Sexually transmitted viruses  Discharge.  Swelling.  Redness.  Rash. How is this treated? Viruses can be difficult to treat because they live within cells. Antibiotic medicines do not treat viruses because these drugs do not get inside cells. Treatment for a viral illness may include:  Resting and drinking plenty of fluids.  Medicines to relieve symptoms. These can include over-the-counter medicine for pain and fever, medicines for cough or congestion, and medicines to relieve diarrhea.  Antiviral  medicines. These drugs are available only for certain types of viruses. They may help reduce flu symptoms if taken early. There are also many antiviral medicines for hepatitis and HIV/AIDS.  Some viral illnesses can be prevented with vaccinations. A common example is the flu shot. Follow these instructions at home: Medicines   Take over-the-counter and prescription medicines  only as told by your health care provider.  If you were prescribed an antiviral medicine, take it as told by your health care provider. Do not stop taking the medicine even if you start to feel better.  Be aware of when antibiotics are needed and when they are not needed. Antibiotics do not treat viruses. If your health care provider thinks that you may have a bacterial infection as well as a viral infection, you may get an antibiotic. ? Do not ask for an antibiotic prescription if you have been diagnosed with a viral illness. That will not make your illness go away faster. ? Frequently taking antibiotics when they are not needed can lead to antibiotic resistance. When this develops, the medicine no longer works against the bacteria that it normally fights. General instructions  Drink enough fluids to keep your urine clear or pale yellow.  Rest as much as possible.  Return to your normal activities as told by your health care provider. Ask your health care provider what activities are safe for you.  Keep all follow-up visits as told by your health care provider. This is important. How is this prevented? Take these actions to reduce your risk of viral infection:  Eat a healthy diet and get enough rest.  Wash your hands often with soap and water. This is especially important when you are in public places. If soap and water are not available, use hand sanitizer.  Avoid close contact with friends and family who have a viral illness.  If you travel to areas where viral gastrointestinal infection is common, avoid drinking water or eating raw food.  Keep your immunizations up to date. Get a flu shot every year as told by your health care provider.  Do not share toothbrushes, nail clippers, razors, or needles with other people.  Always practice safe sex.  Contact a health care provider if:  You have symptoms of a viral illness that do not go away.  Your symptoms come back after going  away.  Your symptoms get worse. Get help right away if:  You have trouble breathing.  You have a severe headache or a stiff neck.  You have severe vomiting or abdominal pain. This information is not intended to replace advice given to you by your health care provider. Make sure you discuss any questions you have with your health care provider. Document Released: 01/29/2016 Document Revised: 03/02/2016 Document Reviewed: 01/29/2016 Elsevier Interactive Patient Education  [employer]2018 [employer]

## 2017-11-08 ENCOUNTER — Other Ambulatory Visit: Payer: Self-pay | Admitting: Physician Assistant

## 2017-12-10 ENCOUNTER — Other Ambulatory Visit: Payer: Self-pay | Admitting: Physician Assistant

## 2018-01-02 ENCOUNTER — Encounter: Payer: Self-pay | Admitting: Physician Assistant

## 2018-01-08 ENCOUNTER — Other Ambulatory Visit: Payer: Self-pay | Admitting: Physician Assistant

## 2018-01-12 ENCOUNTER — Other Ambulatory Visit: Payer: Self-pay | Admitting: Physician Assistant

## 2018-01-12 DIAGNOSIS — I10 Essential (primary) hypertension: Secondary | ICD-10-CM

## 2018-01-12 DIAGNOSIS — F411 Generalized anxiety disorder: Secondary | ICD-10-CM

## 2018-01-13 ENCOUNTER — Other Ambulatory Visit: Payer: Self-pay | Admitting: Physician Assistant

## 2018-01-15 NOTE — Telephone Encounter (Signed)
Last OV 09/14/2017 Last refill 10/12/2017 Ok to refill?

## 2018-02-09 ENCOUNTER — Other Ambulatory Visit: Payer: Self-pay | Admitting: Physician Assistant

## 2018-02-12 ENCOUNTER — Encounter: Payer: Self-pay | Admitting: Physician Assistant

## 2018-02-12 ENCOUNTER — Ambulatory Visit (INDEPENDENT_AMBULATORY_CARE_PROVIDER_SITE_OTHER): Payer: BLUE CROSS/BLUE SHIELD | Admitting: Physician Assistant

## 2018-02-12 ENCOUNTER — Other Ambulatory Visit: Payer: Self-pay | Admitting: Family Medicine

## 2018-02-12 VITALS — BP 134/86 | HR 82 | Temp 98.2°F | Resp 16 | Ht 66.0 in | Wt 314.0 lb

## 2018-02-12 DIAGNOSIS — G4733 Obstructive sleep apnea (adult) (pediatric): Secondary | ICD-10-CM

## 2018-02-12 DIAGNOSIS — R5383 Other fatigue: Secondary | ICD-10-CM | POA: Diagnosis not present

## 2018-02-12 LAB — COMPLETE METABOLIC PANEL WITH GFR
AG RATIO: 1.4 (calc) (ref 1.0–2.5)
ALT: 38 U/L (ref 9–46)
AST: 26 U/L (ref 10–40)
Albumin: 4.3 g/dL (ref 3.6–5.1)
Alkaline phosphatase (APISO): 69 U/L (ref 40–115)
BILIRUBIN TOTAL: 0.3 mg/dL (ref 0.2–1.2)
BUN: 9 mg/dL (ref 7–25)
CHLORIDE: 103 mmol/L (ref 98–110)
CO2: 25 mmol/L (ref 20–32)
Calcium: 9.4 mg/dL (ref 8.6–10.3)
Creat: 0.79 mg/dL (ref 0.60–1.35)
GFR, EST AFRICAN AMERICAN: 135 mL/min/{1.73_m2} (ref >=60)
GFR, Est Non African American: 116 mL/min/{1.73_m2} (ref >=60)
GLUCOSE: 92 mg/dL (ref 65–99)
Globulin: 3 g/dL (calc) (ref 1.9–3.7)
Potassium: 4.8 mmol/L (ref 3.5–5.3)
Sodium: 136 mmol/L (ref 135–146)
TOTAL PROTEIN: 7.3 g/dL (ref 6.1–8.1)

## 2018-02-12 LAB — TSH: TSH: 2.36 mIU/L (ref 0.40–4.50)

## 2018-02-12 LAB — CBC WITH DIFFERENTIAL/PLATELET
BASOS PCT: 0.4 %
Basophils Absolute: 36 cells/uL (ref 0–200)
EOS ABS: 218 {cells}/uL (ref 15–500)
Eosinophils Relative: 2.4 %
HCT: 43.8 % (ref 38.5–50.0)
Hemoglobin: 15.1 g/dL (ref 13.2–17.1)
Lymphs Abs: 2657 cells/uL (ref 850–3900)
MCH: 28.8 pg (ref 27.0–33.0)
MCHC: 34.5 g/dL (ref 32.0–36.0)
MCV: 83.6 fL (ref 80.0–100.0)
MONOS PCT: 5.3 %
MPV: 12.5 fL (ref 7.5–12.5)
NEUTROS ABS: 5706 {cells}/uL (ref 1500–7800)
Neutrophils Relative %: 62.7 %
PLATELETS: 194 10*3/uL (ref 140–400)
RBC: 5.24 10*6/uL (ref 4.20–5.80)
RDW: 13.1 % (ref 11.0–15.0)
Total Lymphocyte: 29.2 %
WBC: 9.1 10*3/uL (ref 3.8–10.8)
WBCMIX: 482 {cells}/uL (ref 200–950)
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
## 2018-02-12 NOTE — Telephone Encounter (Signed)
Ok to refill??  Last office visit 10/19/2017.  Last refill 01/15/2018.

## 2018-03-13 ENCOUNTER — Other Ambulatory Visit: Payer: Self-pay | Admitting: Physician Assistant

## 2018-03-13 ENCOUNTER — Encounter: Payer: Self-pay | Admitting: Physician Assistant

## 2018-03-15 ENCOUNTER — Ambulatory Visit: Payer: BLUE CROSS/BLUE SHIELD | Admitting: Physician Assistant

## 2018-03-19 ENCOUNTER — Ambulatory Visit: Payer: BLUE CROSS/BLUE SHIELD | Admitting: Physician Assistant

## 2018-03-19 ENCOUNTER — Encounter: Payer: Self-pay | Admitting: Physician Assistant

## 2018-03-19 VITALS — BP 132/84 | HR 83 | Temp 98.1°F | Resp 18 | Ht 66.0 in | Wt 313.0 lb

## 2018-03-19 DIAGNOSIS — I10 Essential (primary) hypertension: Secondary | ICD-10-CM | POA: Diagnosis not present

## 2018-03-19 DIAGNOSIS — J988 Other specified respiratory disorders: Secondary | ICD-10-CM

## 2018-03-19 DIAGNOSIS — G4733 Obstructive sleep apnea (adult) (pediatric): Secondary | ICD-10-CM

## 2018-03-19 DIAGNOSIS — B9689 Other specified bacterial agents as the cause of diseases classified elsewhere: Secondary | ICD-10-CM

## 2018-03-19 DIAGNOSIS — F411 Generalized anxiety disorder: Secondary | ICD-10-CM | POA: Diagnosis not present

## 2018-03-19 DIAGNOSIS — F17200 Nicotine dependence, unspecified, uncomplicated: Secondary | ICD-10-CM | POA: Diagnosis not present

## 2018-03-19 DIAGNOSIS — G4700 Insomnia, unspecified: Secondary | ICD-10-CM

## 2018-03-19 DIAGNOSIS — K219 Gastro-esophageal reflux disease without esophagitis: Secondary | ICD-10-CM | POA: Diagnosis not present

## 2018-03-19 MED ORDER — AZITHROMYCIN 250 MG PO TABS: ORAL_TABLET | ORAL | 0 refills | Status: DC
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
## 2018-03-22 ENCOUNTER — Encounter: Payer: Self-pay | Admitting: Physician Assistant

## 2018-04-12 ENCOUNTER — Other Ambulatory Visit: Payer: Self-pay | Admitting: Physician Assistant

## 2018-04-23 ENCOUNTER — Encounter: Payer: Self-pay | Admitting: Physician Assistant

## 2018-05-12 ENCOUNTER — Other Ambulatory Visit: Payer: Self-pay | Admitting: Physician Assistant

## 2018-05-14 NOTE — Telephone Encounter (Signed)
Last OV 02/12/2018 Last refill 02/12/2018 Ok to refill?

## 2018-06-04 ENCOUNTER — Other Ambulatory Visit: Payer: Self-pay | Admitting: Physician Assistant

## 2018-06-04 DIAGNOSIS — I10 Essential (primary) hypertension: Secondary | ICD-10-CM

## 2018-07-14 ENCOUNTER — Other Ambulatory Visit: Payer: Self-pay | Admitting: Physician Assistant

## 2018-07-16 NOTE — Telephone Encounter (Signed)
Last OV 03/19/2018 Last refill 05/14/2018 Ok to refill?

## 2018-08-10 ENCOUNTER — Other Ambulatory Visit: Payer: Self-pay | Admitting: Physician Assistant

## 2018-08-10 DIAGNOSIS — L309 Dermatitis, unspecified: Secondary | ICD-10-CM

## 2018-09-13 ENCOUNTER — Other Ambulatory Visit: Payer: Self-pay | Admitting: Family Medicine

## 2018-09-14 ENCOUNTER — Ambulatory Visit: Payer: BLUE CROSS/BLUE SHIELD | Admitting: Family Medicine

## 2018-09-14 ENCOUNTER — Other Ambulatory Visit: Payer: Self-pay | Admitting: Family Medicine

## 2018-09-14 DIAGNOSIS — I10 Essential (primary) hypertension: Secondary | ICD-10-CM

## 2018-09-14 MED ORDER — ALPRAZOLAM 1 MG PO TABS: 1.0000 mg | ORAL_TABLET | Freq: Every day | ORAL | 1 refills | Status: DC

## 2018-09-14 MED ORDER — AMLODIPINE BESYLATE 5 MG PO TABS: ORAL_TABLET | ORAL | 0 refills | Status: DC

## 2018-10-01 ENCOUNTER — Ambulatory Visit: Payer: BLUE CROSS/BLUE SHIELD | Admitting: Family Medicine

## 2018-10-01 ENCOUNTER — Encounter: Payer: Self-pay | Admitting: Family Medicine

## 2018-10-01 VITALS — BP 130/70 | HR 76 | Temp 98.3°F | Resp 18 | Ht 66.0 in | Wt 304.0 lb

## 2018-10-01 DIAGNOSIS — I10 Essential (primary) hypertension: Secondary | ICD-10-CM

## 2018-10-01 DIAGNOSIS — G4700 Insomnia, unspecified: Secondary | ICD-10-CM | POA: Diagnosis not present

## 2018-10-01 DIAGNOSIS — F411 Generalized anxiety disorder: Secondary | ICD-10-CM

## 2018-10-01 DIAGNOSIS — Z6841 Body Mass Index (BMI) 40.0 and over, adult: Secondary | ICD-10-CM

## 2018-10-01 DIAGNOSIS — K219 Gastro-esophageal reflux disease without esophagitis: Secondary | ICD-10-CM

## 2018-10-01 DIAGNOSIS — G4733 Obstructive sleep apnea (adult) (pediatric): Secondary | ICD-10-CM

## 2018-10-01 MED ORDER — PHENTERMINE HCL 37.5 MG PO TABS: 37.5000 mg | ORAL_TABLET | Freq: Every day | ORAL | 2 refills | Status: DC

## 2018-10-01 NOTE — Assessment & Plan Note (Signed)
Xanax at bedtime for sleep and anxiety

## 2018-10-01 NOTE — Assessment & Plan Note (Signed)
Continue PPI symptoms controlled

## 2018-10-01 NOTE — Progress Notes (Signed)
   Subjective:    Patient ID: Kyle Mays, male    DOB: 02-23-1981, 37 y.o.   MRN: [id number]  Patient presents for Medication Management and Medication Refill  Here to follow-up chronic medical problems.  He was last being seen by my PA he was no longer at the practice.  He is currently being treated for hypertension as well as anxiety and insomnia.  Generalized anxiety he was previously on Paxil but stopped it secondary to weight gain and also made him more fatigued.  Is also been on Zoloft and Wellbutrin in the past.  Does take alprazolam at bedtime for insomnia. Feels he is doing well, no concerns with stressors  Hypertension he is currently on amlodipine 5 mg once a day, due for lipid panel, renal function  Acid reflux currently on omeprazole 20 mg once a day   OSA- on CPAP therapy, doing well    He has been trying to lose weight, has had difficulty past few months. Has cut out sugar, soft drinks, weight down 10lbs, he is constantly hungry He has been eating egg white in the morning to help with protein, has cut out a lot of fast food Tries to stay active  normal TSH a few months ago  Has [employer] license for 1 year   Review Of Systems:  GEN- denies fatigue, fever, weight loss,weakness, recent illness HEENT- denies eye drainage, change in vision, nasal discharge, CVS- denies chest pain, palpitations RESP- denies SOB, cough, wheeze ABD- denies N/V, change in stools, abd pain GU- denies dysuria, hematuria, dribbling, incontinence MSK- denies joint pain, muscle aches, injury Neuro- denies headache, dizziness, syncope, seizure activity       Objective:    BP 130/70   Pulse 76   Temp 98.3 F (36.8 C) (Oral)   Resp 18   Ht 5\' 6"  (1.676 m)   Wt (!) 304 lb (137.9 kg)   BMI 49.07 kg/m  GEN- NAD, alert and oriented x3,obese  HEENT- PERRL, EOMI, non injected sclera, pink conjunctiva, MMM, oropharynx clear Neck- Supple, no thyromegaly CVS- RRR, no  murmur RESP-CTAB ABD-NABS,soft,NT,ND Psych- normal affect and mood  EXT- No edema Pulses- Radial, DP- 2+        Assessment & Plan:      Problem List Items Addressed This Visit      Unprioritized   Essential hypertension - Primary    Well controlled no changes       Generalized anxiety disorder    Xanax at bedtime for sleep and anxiety      GERD (gastroesophageal reflux disease)    Continue PPI symptoms controlled       Insomnia   Obesity    Ultimate goal 220lbs Discussed weight loss medication Will try Phentermine 37.5mg  once a day, discussed medication and side effects  will start with 1/2 tablet and taper up      Relevant Medications   phentermine (ADIPEX-P) 37.5 MG tablet   Sleep apnea    Continues to benefit from CPAP Therapy         Note: This dictation was prepared with Dragon dictation along with smaller phrase technology. Any transcriptional errors that result from this process are unintentional.

## 2018-10-01 NOTE — Assessment & Plan Note (Signed)
Continues to benefit from CPAP Therapy 

## 2018-10-01 NOTE — Progress Notes (Signed)
   Subjective:    Patient ID: Kyle Mays, male    DOB: 01/06/1981, 37 y.o.   MRN: [id number]  Patient presents for Medication Management and Medication Refill  Here to follow-up chronic medical problems.  He was last being seen by my PA he was no longer at the practice.  He is currently being treated for hypertension as well as anxiety and insomnia.  Underlies anxiety he was previously on Paxil but stopped it secondary to weight gain and also made him more fatigued.  Is also been on Zoloft and Wellbutrin in the past.  Does take alprazolam at bedtime for insomnia.  Hypertension he is currently on amlodipine 5 mg once a day, due for lipid panel, renal function  Acid reflux currently on omeprazole 20 mg once a day   OSA- on CPAP therapy   Review Of Systems:  GEN- denies fatigue, fever, weight loss,weakness, recent illness HEENT- denies eye drainage, change in vision, nasal discharge, CVS- denies chest pain, palpitations RESP- denies SOB, cough, wheeze ABD- denies N/V, change in stools, abd pain GU- denies dysuria, hematuria, dribbling, incontinence MSK- denies joint pain, muscle aches, injury Neuro- denies headache, dizziness, syncope, seizure activity       Objective:    Ht 5\' 6"  (1.676 m)   Wt (!) 304 lb (137.9 kg)   BMI 49.07 kg/m  GEN- NAD, alert and oriented x3 HEENT- PERRL, EOMI, non injected sclera, pink conjunctiva, MMM, oropharynx clear Neck- Supple, no thyromegaly CVS- RRR, no murmur RESP-CTAB ABD-NABS,soft,NT,ND EXT- No edema Pulses- Radial, DP- 2+        Assessment & Plan:      Problem List Items Addressed This Visit      Unprioritized   Essential hypertension - Primary   Generalized anxiety disorder   Insomnia      Note: This dictation was prepared with Dragon dictation along with smaller phrase technology. Any transcriptional errors that result from this process are unintentional.

## 2018-10-01 NOTE — Assessment & Plan Note (Signed)
Ultimate goal 220lbs Discussed weight loss medication Will try Phentermine 37.5mg  once a day, discussed medication and side effects  will start with 1/2 tablet and taper up

## 2018-10-01 NOTE — Assessment & Plan Note (Signed)
Well controlled no changes 

## 2018-10-01 NOTE — Patient Instructions (Signed)
Start the phentermine take 1/2 tablet once a day for 1 week, then go to full tablet  F/U 4 weeks fasting

## 2018-10-09 ENCOUNTER — Other Ambulatory Visit: Payer: Self-pay | Admitting: Family Medicine

## 2018-10-29 ENCOUNTER — Ambulatory Visit: Payer: BLUE CROSS/BLUE SHIELD | Admitting: Family Medicine

## 2018-10-29 ENCOUNTER — Other Ambulatory Visit: Payer: BLUE CROSS/BLUE SHIELD

## 2018-10-29 DIAGNOSIS — Z13220 Encounter for screening for lipoid disorders: Secondary | ICD-10-CM

## 2018-10-29 DIAGNOSIS — I10 Essential (primary) hypertension: Secondary | ICD-10-CM | POA: Diagnosis not present

## 2018-10-29 DIAGNOSIS — Z136 Encounter for screening for cardiovascular disorders: Secondary | ICD-10-CM

## 2018-10-30 LAB — CBC WITH DIFFERENTIAL/PLATELET
Absolute Monocytes: 410 cells/uL (ref 200–950)
BASOS PCT: 0.5 %
Basophils Absolute: 38 cells/uL (ref 0–200)
EOS PCT: 1.7 %
Eosinophils Absolute: 129 cells/uL (ref 15–500)
HCT: 41.7 % (ref 38.5–50.0)
Hemoglobin: 13.6 g/dL (ref 13.2–17.1)
LYMPHS ABS: 1961 {cells}/uL (ref 850–3900)
MCH: 27.8 pg (ref 27.0–33.0)
MCHC: 32.6 g/dL (ref 32.0–36.0)
MCV: 85.3 fL (ref 80.0–100.0)
MPV: 13.6 fL — AB (ref 7.5–12.5)
Monocytes Relative: 5.4 %
Neutro Abs: 5062 cells/uL (ref 1500–7800)
Neutrophils Relative %: 66.6 %
PLATELETS: 152 10*3/uL (ref 140–400)
RBC: 4.89 10*6/uL (ref 4.20–5.80)
RDW: 12.5 % (ref 11.0–15.0)
TOTAL LYMPHOCYTE: 25.8 %
WBC: 7.6 10*3/uL (ref 3.8–10.8)

## 2018-10-30 LAB — COMPLETE METABOLIC PANEL WITH GFR
AG Ratio: 1.7 (calc) (ref 1.0–2.5)
ALBUMIN MSPROF: 4.3 g/dL (ref 3.6–5.1)
ALT: 47 U/L — ABNORMAL HIGH (ref 9–46)
AST: 21 U/L (ref 10–40)
Alkaline phosphatase (APISO): 70 U/L (ref 40–115)
BUN: 15 mg/dL (ref 7–25)
CALCIUM: 9.2 mg/dL (ref 8.6–10.3)
CO2: 22 mmol/L (ref 20–32)
CREATININE: 0.81 mg/dL (ref 0.60–1.35)
Chloride: 104 mmol/L (ref 98–110)
GFR, EST AFRICAN AMERICAN: 133 mL/min/{1.73_m2} (ref >=60)
GFR, EST NON AFRICAN AMERICAN: 114 mL/min/{1.73_m2} (ref >=60)
GLUCOSE: 96 mg/dL (ref 65–99)
Globulin: 2.6 g/dL (calc) (ref 1.9–3.7)
Potassium: 4.2 mmol/L (ref 3.5–5.3)
Sodium: 141 mmol/L (ref 135–146)
TOTAL PROTEIN: 6.9 g/dL (ref 6.1–8.1)
Total Bilirubin: 0.3 mg/dL (ref 0.2–1.2)

## 2018-10-30 LAB — LIPID PANEL
Cholesterol: 155 mg/dL (ref <200)
HDL: 38 mg/dL — ABNORMAL LOW (ref >40)
LDL Cholesterol (Calc): 95 mg/dL (calc)
Non-HDL Cholesterol (Calc): 117 mg/dL (calc) (ref <130)
Total CHOL/HDL Ratio: 4.1 (calc) (ref <5.0)
Triglycerides: 122 mg/dL (ref <150)

## 2018-11-02 ENCOUNTER — Ambulatory Visit: Payer: BLUE CROSS/BLUE SHIELD | Admitting: Family Medicine

## 2018-11-07 ENCOUNTER — Encounter: Payer: Self-pay | Admitting: Family Medicine

## 2018-11-07 ENCOUNTER — Ambulatory Visit: Payer: BLUE CROSS/BLUE SHIELD | Admitting: Family Medicine

## 2018-11-07 ENCOUNTER — Other Ambulatory Visit: Payer: Self-pay

## 2018-11-07 DIAGNOSIS — I10 Essential (primary) hypertension: Secondary | ICD-10-CM

## 2018-11-07 DIAGNOSIS — Z6841 Body Mass Index (BMI) 40.0 and over, adult: Secondary | ICD-10-CM

## 2018-11-07 MED ORDER — PHENTERMINE HCL 15 MG PO TBDP: ORAL_TABLET | ORAL | 2 refills | Status: DC

## 2018-11-07 MED ORDER — ALPRAZOLAM 1 MG PO TABS: 1.0000 mg | ORAL_TABLET | Freq: Every day | ORAL | 1 refills | Status: DC

## 2018-11-07 NOTE — Progress Notes (Signed)
   Subjective:    Patient ID: Kyle Mays, male    DOB: 20-Jan-1981, 38 y.o.   MRN: [id number]  Patient presents for Follow-up (has had labs)  Pt here to f/u weight on phentermine   Taking phentermine 1/2 tablet daily because when he went up to 1 full tablet became very jittery.  Walking for exercise on the job and at home  weight down almost 30lbs since December 30th  HE is on low carb diet, with protein and healthy fats  No concern with BP medication, BP has been normal when checked         Review Of Systems:  GEN- denies fatigue, fever, weight loss,weakness, recent illness HEENT- denies eye drainage, change in vision, nasal discharge, CVS- denies chest pain, palpitations RESP- denies SOB, cough, wheeze ABD- denies N/V, change in stools, abd pain GU- denies dysuria, hematuria, dribbling, incontinence MSK- denies joint pain, muscle aches, injury Neuro- denies headache, dizziness, syncope, seizure activity       Objective:    BP 126/74   Pulse 82   Temp 98.3 F (36.8 C) (Oral)   Resp 12   Ht 5\' 6"  (1.676 m)   Wt 274 lb (124.3 kg)   SpO2 98%   BMI 44.22 kg/m  GEN- NAD, alert and oriented x3 HEENT- PERRL, EOMI, non injected sclera, pink conjunctiva, MMM, oropharynx clear CVS- RRR, no murmur RESP-CTAB EXT- No edema Pulses- Radial  2+        Assessment & Plan:      Problem List Items Addressed This Visit      Unprioritized   Essential hypertension    Well controlled      Obesity    He has taken to this new healthy lifestyle and change in diet Will change him to phentermine 15mg  once a day due to side effects F/U in a few months with weight BP well controlled      Relevant Medications   Phentermine HCl 15 MG TBDP      Note: This dictation was prepared with Dragon dictation along with smaller phrase technology. Any transcriptional errors that result from this process are unintentional.

## 2018-11-07 NOTE — Patient Instructions (Signed)
F/U  3 months 

## 2018-11-08 ENCOUNTER — Encounter: Payer: Self-pay | Admitting: Family Medicine

## 2018-11-08 NOTE — Assessment & Plan Note (Signed)
He has taken to this new healthy lifestyle and change in diet Will change him to phentermine 15mg  once a day due to side effects F/U in a few months with weight BP well controlled

## 2018-11-08 NOTE — Assessment & Plan Note (Signed)
Well controlled 

## 2018-11-09 ENCOUNTER — Ambulatory Visit: Payer: BLUE CROSS/BLUE SHIELD | Admitting: Family Medicine

## 2018-11-16 ENCOUNTER — Other Ambulatory Visit: Payer: Self-pay | Admitting: Family Medicine

## 2018-11-27 ENCOUNTER — Ambulatory Visit: Payer: BLUE CROSS/BLUE SHIELD | Admitting: Family Medicine

## 2018-11-27 ENCOUNTER — Encounter: Payer: Self-pay | Admitting: Family Medicine

## 2018-11-27 VITALS — BP 128/84 | HR 86 | Temp 99.7°F | Resp 18 | Ht 66.0 in | Wt 270.0 lb

## 2018-11-27 DIAGNOSIS — J069 Acute upper respiratory infection, unspecified: Secondary | ICD-10-CM

## 2018-11-27 DIAGNOSIS — R6889 Other general symptoms and signs: Secondary | ICD-10-CM | POA: Diagnosis not present

## 2018-11-27 LAB — INFLUENZA A AND B AG, IMMUNOASSAY
INFLUENZA A ANTIGEN: NOT DETECTED
INFLUENZA B ANTIGEN: NOT DETECTED

## 2018-11-27 NOTE — Progress Notes (Signed)
   Subjective:    Patient ID: Kyle Mays, male    DOB: 1981-05-18, 38 y.o.   MRN: [id number]  Patient presents for Illness (x2 days- fever/ chills, productive cough, nasal congestion, body aches)  Cough with congestion for the past few days. Fever yesterday. Cough with mild production. Chills and body aches last night Taking advil cold and Flu medicine used. Wife was sick recently   No vomiting or diarrhea  Tried to work this morning but he felt bad so he left.   Review Of Systems:  GEN- denies fatigue, fever, weight loss,weakness, recent illness HEENT- denies eye drainage, change in vision, nasal discharge, CVS- denies chest pain, palpitations RESP- denies SOB, +cough, wheeze ABD- denies N/V, change in stools, abd pain GU- denies dysuria, hematuria, dribbling, incontinence MSK- denies joint pain, muscle aches, injury Neuro- denies headache, dizziness, syncope, seizure activity       Objective:    BP 128/84   Pulse 86   Temp 99.7 F (37.6 C) (Oral)   Resp 18   Ht 5\' 6"  (1.676 m)   Wt 270 lb (122.5 kg)   SpO2 95%   BMI 43.58 kg/m  GEN- NAD, alert and oriented x3 HEENT- PERRL, EOMI, non injected sclera, pink conjunctiva, MMM, oropharynx clear, TM clear bilat no effusion,   no maxillary sinus tenderness, inflammed turbinates,  Nasal drainage  Neck- Supple, no LAD CVS- RRR, no murmur RESP-CTAB EXT- No edema Pulses- Radial 2+         Assessment & Plan:      Problem List Items Addressed This Visit    None    Visit Diagnoses    Viral URI    -  Primary   Flu negative viral URI symptoms.  Symptomatically at home Mucinex DM fluids rest nasal spray for drainage.  Note Given for work   Relevant Orders   Influenza A and B Ag, Immunoassay (Completed)      Note: This dictation was prepared with Dragon dictation along with smaller phrase technology. Any transcriptional errors that result from this process are unintentional.

## 2018-11-27 NOTE — Patient Instructions (Addendum)
Flu negative  Use flonase or nasal saline Mucinex DM Stop the phentermine while you are sick  Plenty of fluids  F/U as previous   Viral Respiratory Infection A viral respiratory infection is an illness that affects parts of the body that are used for breathing. These include the lungs, nose, and throat. It is caused by a germ called a virus. Some examples of this kind of infection are:  A cold.  The flu (influenza).  A respiratory syncytial virus (RSV) infection. A person who gets this illness may have the following symptoms:  A stuffy or runny nose.  Yellow or green fluid in the nose.  A cough.  Sneezing.  Tiredness (fatigue).  Achy muscles.  A sore throat.  Sweating or chills.  A fever.  A headache. Follow these instructions at home: Managing pain and congestion  Take over-the-counter and prescription medicines only as told by your doctor.  If you have a sore throat, gargle with salt water. Do this 3-4 times per day or as needed. To make a salt-water mixture, dissolve -1 tsp of salt in 1 cup of warm water. Make sure that all the salt dissolves.  Use nose drops made from salt water. This helps with stuffiness (congestion). It also helps soften the skin around your nose.  Drink enough fluid to keep your pee (urine) pale yellow. General instructions   Rest as much as possible.  Do not drink alcohol.  Do not use any products that have nicotine or tobacco, such as cigarettes and e-cigarettes. If you need help quitting, ask your doctor.  Keep all follow-up visits as told by your doctor. This is important. How is this prevented?   Get a flu shot every year. Ask your doctor when you should get your flu shot.  Do not let other people get your germs. If you are sick: ? Stay home from work or school. ? Wash your hands with soap and water often. Wash your hands after you cough or sneeze. If soap and water are not available, use hand sanitizer.  Avoid contact  with people who are sick during cold and flu season. This is in fall and winter. Get help if:  Your symptoms last for 10 days or longer.  Your symptoms get worse over time.  You have a fever.  You have very bad pain in your face or forehead.  Parts of your jaw or neck become very swollen. Get help right away if:  You feel pain or pressure in your chest.  You have shortness of breath.  You faint or feel like you will faint.  You keep throwing up (vomiting).  You feel confused. Summary  A viral respiratory infection is an illness that affects parts of the body that are used for breathing.  Examples of this illness include a cold, the flu, and respiratory syncytial virus (RSV) infection.  The infection can cause a runny nose, cough, sneezing, sore throat, and fever.  Follow what your doctor tells you about taking medicines, drinking lots of fluid, washing your hands, resting at home, and avoiding people who are sick. This information is not intended to replace advice given to you by your health care provider. Make sure you discuss any questions you have with your health care provider. Document Released: 09/01/2008 Document Revised: 10/30/2017 Document Reviewed: 10/30/2017 Elsevier Interactive Patient Education  2019 [employer].

## 2018-11-30 ENCOUNTER — Encounter: Payer: Self-pay | Admitting: *Deleted

## 2018-12-17 ENCOUNTER — Other Ambulatory Visit: Payer: Self-pay | Admitting: *Deleted

## 2018-12-17 DIAGNOSIS — I10 Essential (primary) hypertension: Secondary | ICD-10-CM

## 2018-12-17 MED ORDER — AMLODIPINE BESYLATE 5 MG PO TABS: ORAL_TABLET | ORAL | 0 refills | Status: DC

## 2018-12-21 ENCOUNTER — Other Ambulatory Visit: Payer: Self-pay | Admitting: Family Medicine

## 2018-12-21 MED ORDER — ALPRAZOLAM 1 MG PO TABS: 1.0000 mg | ORAL_TABLET | Freq: Every day | ORAL | 1 refills | Status: DC

## 2019-01-01 ENCOUNTER — Encounter: Payer: Self-pay | Admitting: Family Medicine

## 2019-01-01 ENCOUNTER — Ambulatory Visit: Payer: BLUE CROSS/BLUE SHIELD | Admitting: Family Medicine

## 2019-01-01 ENCOUNTER — Other Ambulatory Visit: Payer: Self-pay

## 2019-01-01 VITALS — BP 120/72 | HR 70 | Temp 98.5°F | Resp 18 | Ht 66.0 in | Wt 253.4 lb

## 2019-01-01 DIAGNOSIS — W57XXXA Bitten or stung by nonvenomous insect and other nonvenomous arthropods, initial encounter: Secondary | ICD-10-CM

## 2019-01-01 DIAGNOSIS — S30860A Insect bite (nonvenomous) of lower back and pelvis, initial encounter: Secondary | ICD-10-CM

## 2019-01-01 DIAGNOSIS — R21 Rash and other nonspecific skin eruption: Secondary | ICD-10-CM | POA: Diagnosis not present

## 2019-01-01 MED ORDER — HYDROXYZINE HCL 25 MG PO TABS: 25.0000 mg | ORAL_TABLET | Freq: Three times a day (TID) | ORAL | 0 refills | Status: DC | PRN

## 2019-01-01 MED ORDER — DOXYCYCLINE HYCLATE 100 MG PO TABS: 100.0000 mg | ORAL_TABLET | Freq: Two times a day (BID) | ORAL | 0 refills | Status: DC

## 2019-01-01 NOTE — Progress Notes (Signed)
Patient ID: Kyle Mays, male    DOB: 05/31/1981, 38 y.o.   MRN: [id number]  PCP: Salley Scarlet, MD  Chief Complaint  Patient presents with  . Insect Bite    on back, got ticks off on 03/29, itchy, tender, oozing spot, taken benedryl    Subjective:   Kyle Mays is a 38 y.o. male, presents to clinic with CC of itchy red spots to back x 2 days after ticks removed.  He was out fishing Sunday, 2 days ago, he had several ticks on his back that were removed when they finished fishing.  Removed intact with tweezers, and alcohol was applied to the skin.  There were several on his back.  His back began to itch and he has been scratching it frequently, even scratched a lot of the skin off.  Skin around tick bites has become swollen, red and gotten bigger over the past 2 days.  Worse at night.  Benadryl does not help decrease the severity of itching.  It only is "sore" when he scratches it hard, or rubs back against something to itch.  The scratched areas of skin are draining clear fluid.  His wife wanted it to get checked out.  He has no associated fever chills sweats body aches, joint pain or swelling, headaches, sore throat, neck pain or stiffness.  He does report a history once in a long time ago of infection on his leg which he describes as a boil but denies immunocompromise, history of recurrent abscesses or MRSA.     Patient Active Problem List   Diagnosis Date Noted  . Sleep apnea 04/18/2017  . GERD (gastroesophageal reflux disease) 01/11/2017  . Essential hypertension 09/19/2016  . Generalized anxiety disorder 04/27/2016  . Insomnia 04/27/2016  . Obesity 04/27/2016  . Smoker 06/04/2015     Prior to Admission medications   Medication Sig Start Date End Date Taking? Authorizing Provider  ALPRAZolam Prudy Feeler) 1 MG tablet Take 1 tablet (1 mg total) by mouth at bedtime. 12/21/18  Yes [place], Velna Hatchet, MD  amLODipine (NORVASC) 5 MG tablet TAKE 1 TABLET(5 MG) BY MOUTH DAILY  12/17/18  Yes Ken Caryl, Velna Hatchet, MD  Ibuprofen 200 MG CAPS Take by mouth.   Yes [provider]  omeprazole (PRILOSEC) 20 MG capsule TAKE 1 CAPSULE BY MOUTH DAILY AS NEEDED 11/16/18  Yes [place], Velna Hatchet, MD  Phentermine HCl 15 MG TBDP 1 tablet daily 11/07/18  Yes [place], Velna Hatchet, MD  triamcinolone cream (KENALOG) 0.5 % APPLY EXTERNALLY TO THE AFFECTED AREA THREE TIMES DAILY 08/10/18  Yes Donita Brooks, MD     No Known Allergies   Family History  Problem Relation Age of Onset  . Depression Mother   . Early death Father   . Birth defects Sister   . Kidney disease Sister   . Hyperlipidemia Maternal Grandmother   . Diabetes Maternal Grandfather   . Heart disease Maternal Grandfather   . Hypertension Maternal Grandfather   . Stroke Maternal Grandfather      Social History   Socioeconomic History  . Marital status: Married    Spouse name: Not on file  . Number of children: Not on file  . Years of education: Not on file  . Highest education level: Not on file  Occupational History  . Not on file  Social Needs  . Financial resource strain: Not hard at all  . Food insecurity:    Worry: Never true  Inability: Never true  . Transportation needs:    Medical: No    Non-medical: No  Tobacco Use  . Smoking status: Former Smoker    Packs/day: 3.00    Years: 15.00    Pack years: 45.00    Types: Cigarettes    Start date: 06/03/2018    Last attempt to quit: 06/03/2018    Years since quitting: 0.5  . Smokeless tobacco: Current User    Types: Chew  Substance and Sexual Activity  . Alcohol use: No  . Drug use: No  . Sexual activity: Yes  Lifestyle  . Physical activity:    Days per week: 4 days    Minutes per session: 30 min  . Stress: Not at all  Relationships  . Social connections:    Talks on phone: More than three times a week    Gets together: More than three times a week    Attends religious service: Never    Active member of club or organization: No     Attends meetings of clubs or organizations: Never    Relationship status: Married  . Intimate partner violence:    Fear of current or ex partner: No    Emotionally abused: No    Physically abused: No    Forced sexual activity: No  Other Topics Concern  . Not on file  Social History Narrative  . Not on file     Review of Systems  Constitutional: Negative.  Negative for activity change, appetite change, chills, diaphoresis, fatigue and fever.  HENT: Negative.  Negative for sore throat.   Eyes: Negative.   Respiratory: Negative.   Cardiovascular: Negative.   Gastrointestinal: Negative.   Endocrine: Negative.   Genitourinary: Negative.   Musculoskeletal: Negative.  Negative for arthralgias, joint swelling, neck pain and neck stiffness.  Skin: Negative.   Allergic/Immunologic: Negative.  Negative for environmental allergies and immunocompromised state.  Neurological: Negative.  Negative for weakness and headaches.  Hematological: Negative.  Negative for adenopathy.  Psychiatric/Behavioral: Negative.   All other systems reviewed and are negative.      Objective:    Vitals:   01/01/19 1009  BP: 120/72  Pulse: 70  Resp: 18  Temp: 98.5 F (36.9 C)  SpO2: 98%  Weight: 253 lb 6.4 oz (114.9 kg)  Height: 5\' 6"  (1.676 m)      Physical Exam Vitals signs and nursing note reviewed.  Constitutional:      General: He is not in acute distress.    Appearance: He is well-developed. He is obese. He is not ill-appearing, toxic-appearing or diaphoretic.  HENT:     Head: Normocephalic and atraumatic.     Nose: Nose normal.  Eyes:     General:        Right eye: No discharge.        Left eye: No discharge.     Conjunctiva/sclera: Conjunctivae normal.  Neck:     Trachea: No tracheal deviation.  Cardiovascular:     Rate and Rhythm: Normal rate and regular rhythm.  Pulmonary:     Effort: Pulmonary effort is normal. No respiratory distress.     Breath sounds: No stridor.   Musculoskeletal: Normal range of motion.  Skin:    General: Skin is warm and dry.     Comments: See pictures 2 cm upper right back erythematous nodule, non-tender, skin intack Large mid right back area of erythema and edema, roughly 8 x 6 cm with 1.5x1 cm area of central excoriation, weeping  serous fluid, central 4x3 cm is more indurated, but non-tender, no fluctuance, no purulent drainage Right lower back - 4 x 1 cm area of mild edema and erythema with central excoriated papule, also weeping serous fluid  Neurological:     Mental Status: He is alert.     Motor: No abnormal muscle tone.     Coordination: Coordination normal.  Psychiatric:        Behavior: Behavior normal.               Assessment & Plan:   Skin swelling/redness itching secondary to ticks -went fishing 2 days ago ticks were removed after they were done fishing would have been on him less than a few hours not engorged, removed intact.  Unlikely to have transmitted any tickborne illness.  Suspect a local skin reaction.  Substitute hydroxyzine for Benadryl and can apply topical over-the-counter cortisone to intact skin.  He has scratched open few areas of skin and is weeping serous fluid, which I still believe is most likely secondary to a local skin allergic reaction and not infectious, but will give a one-time dose of doxycycline for prophylactic of for tickborne illness, and hold the rest in case he shows signs or symptoms of developing cellulitis.  He was encouraged to contact us if there is any spreading of the area of redness and swelling, any new purulent drainage, or any pain fever or generalized symptoms.    ICD-10-CM   1. Rash and nonspecific skin eruption R21    local skin reaction and scratched open skin with weaping, good wound care to prevent infection, tx itching, f/up if needed  2. Tick bite of back, initial encounter S30.860A    W57.XXXA    tick on for few hours, unlikely to transmit tick born illness,  doxy 200 mg prophylaxis, hold remaning in case of cellulitis, f/up as needed       Danelle Berry, PA-C 01/01/19 10:22 AM

## 2019-01-01 NOTE — Patient Instructions (Addendum)
Take 200 mg doxycycline (the antibiotic) today with a full stomach and full glass of water and do not lay down until an hour after taking the medicine.  Hold the rest of the antibiotics and only start taking if you have signs of infection like draining pus or spreading redness and significant pain - we would also need to recheck you.  You would take the antibiotic 100 mg twice a day   Use the hydroxyzine as needed for itching.  Try over the counter topical steroid - Cortizone 10 over itchy areas - just avoid scratched open skin.

## 2019-01-24 ENCOUNTER — Other Ambulatory Visit: Payer: Self-pay | Admitting: Family Medicine

## 2019-01-24 NOTE — Telephone Encounter (Signed)
Ok to refill??  Last office visit 11/27/2018.  Last refill 12/21/2018.

## 2019-02-04 ENCOUNTER — Encounter: Payer: Self-pay | Admitting: Family Medicine

## 2019-02-04 NOTE — Progress Notes (Signed)
With patient his wife currently has fever with some symptoms concerning for COVID-19 he needs to quarantine at home as well for at least 1 week to make sure he does not develop any symptoms.

## 2019-02-05 ENCOUNTER — Ambulatory Visit: Payer: BLUE CROSS/BLUE SHIELD | Admitting: Family Medicine

## 2019-02-05 ENCOUNTER — Other Ambulatory Visit: Payer: Self-pay

## 2019-02-05 ENCOUNTER — Encounter: Payer: Self-pay | Admitting: Family Medicine

## 2019-02-05 ENCOUNTER — Ambulatory Visit (INDEPENDENT_AMBULATORY_CARE_PROVIDER_SITE_OTHER): Payer: BLUE CROSS/BLUE SHIELD | Admitting: Family Medicine

## 2019-02-05 DIAGNOSIS — F411 Generalized anxiety disorder: Secondary | ICD-10-CM

## 2019-02-05 DIAGNOSIS — K5900 Constipation, unspecified: Secondary | ICD-10-CM

## 2019-02-05 DIAGNOSIS — Z6841 Body Mass Index (BMI) 40.0 and over, adult: Secondary | ICD-10-CM

## 2019-02-05 DIAGNOSIS — G4700 Insomnia, unspecified: Secondary | ICD-10-CM

## 2019-02-05 MED ORDER — PHENTERMINE HCL 37.5 MG PO TABS: 37.5000 mg | ORAL_TABLET | Freq: Every day | ORAL | 1 refills | Status: DC

## 2019-02-05 MED ORDER — POLYETHYLENE GLYCOL 3350 17 GM/SCOOP PO POWD: 17.0000 g | Freq: Two times a day (BID) | ORAL | 1 refills | Status: DC | PRN

## 2019-02-05 NOTE — Progress Notes (Signed)
Virtual Visit via Telephone Note  I connected with Kyle Mays on 02/05/19 at 12:28pm  by telephone and verified that I am speaking with the correct person using two identifiers.   Pt location: At home   Physician location: In office, [facility], Milinda [place] MD I discussed the limitations, risks, security and privacy concerns of performing an evaluation and management service by telephone and the availability of in person appointments. I also discussed with the patient that there may be a patient responsible charge related to this service. The patient expressed understanding and agreed to proceed.   History of Present Illness:  Telephone visit to follow-up his medications.  He is currently on phentermine for weight loss.  His last weight was 247 pounds in the office.  He admits that he has gained 10 pounds since then he has been stress eating.  When he sits around at home feels like he has something to do even that he tries to stay active and he finds himself snacking with his it.  He is currently at home quarantine with his wife that she has had COVID symptoms.  He has not had any symptoms.  He has been on phentermine 37.5 mg half a tablet daily I was reducing him down to 15 mg because he was feeling a little jittery with it however he felt like that wore off so he went back to the full dose but instead of taking all at one time he would take half in the morning the other half at noon and he felt fine but will curb his appetite especially when he was working.  He often forgets to take the other half a tablet when he is at home and he does find himself snacking a lot.  He continues have problems with constipation is noted since he change his diet eating more veggies he has been more constipated.  He is tried over-the-counter laxative which has not helped.  He also tries to increase his water.  He continues to use his lorazepam for his sleep which helps.  He does not feel  depressed or sad just does not like sitting in a house does not feel on edge otherwise.  Observations/Objective: Unable to observe, very polite , NAD  Assessment and Plan: The sitting he is down from his heaviest weight of 315 pounds before we started the phentermine.  He wants to try taking the half doses within a few hours of each other since he seems to tolerate this.  He is going to set an alarm to try this at home to see if this helps curb his eating when he is just sitting around.  We also discussed the long-term plan knowing he will not stay on this medication.  He is willing to speak with a dietitian to help with his long-term plan.  Also discussed trying to get other cardio exercise in.  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 The patient was provided an opportunity to ask questions and all were answered. The patient agreed with the plan and demonstrated an understanding of the instructions.   The patient was advised to call back or seek an in-person  evaluation if the symptoms worsen or if the condition fails to improve as anticipated.  I provided of non-face-to-face time during this encounter.  End time 12:45pm   Milinda Antis, MD

## 2019-02-07 ENCOUNTER — Other Ambulatory Visit: Payer: Self-pay | Admitting: Family Medicine

## 2019-02-11 ENCOUNTER — Other Ambulatory Visit: Payer: Self-pay | Admitting: *Deleted

## 2019-02-11 MED ORDER — OMEPRAZOLE 20 MG PO CPDR: 20.0000 mg | DELAYED_RELEASE_CAPSULE | Freq: Every day | ORAL | 3 refills | Status: DC | PRN

## 2019-02-18 ENCOUNTER — Encounter: Payer: Self-pay | Admitting: *Deleted

## 2019-02-28 ENCOUNTER — Ambulatory Visit: Payer: BLUE CROSS/BLUE SHIELD | Admitting: Registered"

## 2019-03-25 ENCOUNTER — Other Ambulatory Visit: Payer: Self-pay | Admitting: Family Medicine

## 2019-03-25 DIAGNOSIS — I10 Essential (primary) hypertension: Secondary | ICD-10-CM

## 2019-03-27 ENCOUNTER — Telehealth: Payer: Self-pay | Admitting: *Deleted

## 2019-03-27 NOTE — Telephone Encounter (Signed)
Schedule OV, RECOMMEND starting Saxenda

## 2019-03-27 NOTE — Telephone Encounter (Signed)
Received call from patient. [phone]~ telephone.   Reports that he would like to change his weight loss medication (phentermine) as it is causing increased HA and making him feel too "jittery".  MD please advise.

## 2019-03-27 NOTE — Telephone Encounter (Signed)
Call placed to patient and patient made aware.   Appointment scheduled.  

## 2019-04-01 ENCOUNTER — Ambulatory Visit (INDEPENDENT_AMBULATORY_CARE_PROVIDER_SITE_OTHER): Payer: BC Managed Care – PPO | Admitting: Family Medicine

## 2019-04-01 ENCOUNTER — Encounter: Payer: Self-pay | Admitting: Family Medicine

## 2019-04-01 ENCOUNTER — Other Ambulatory Visit: Payer: Self-pay

## 2019-04-01 VITALS — BP 128/68 | HR 96 | Temp 98.1°F | Resp 16 | Ht 66.0 in | Wt 257.0 lb

## 2019-04-01 DIAGNOSIS — Z6841 Body Mass Index (BMI) 40.0 and over, adult: Secondary | ICD-10-CM | POA: Diagnosis not present

## 2019-04-01 DIAGNOSIS — I10 Essential (primary) hypertension: Secondary | ICD-10-CM | POA: Diagnosis not present

## 2019-04-01 MED ORDER — SAXENDA 18 MG/3ML ~~LOC~~ SOPN: 0.6000 mg | PEN_INJECTOR | Freq: Every day | SUBCUTANEOUS | 3 refills | Status: DC

## 2019-04-01 NOTE — Progress Notes (Signed)
   Subjective:    Patient ID: Kyle Mays, male    DOB: Jul 15, 1981, 38 y.o.   MRN: [id number]  Patient presents for Follow-up (weight loss meds)  Phentermine started causing headaches and mood swings. He would go back and forth with different doses but still had SE  He has been eating very small portions but cant sustain that, feels hungry all the times, has cut out most carbs, but feels stuck, thinks about food all the time Interested in trying something else for weight loss    HTN- taking BP meds as prescribed, no SE with medication    Review Of Systems:  GEN- denies fatigue, fever, weight loss,weakness, recent illness HEENT- denies eye drainage, change in vision, nasal discharge, CVS- denies chest pain, palpitations RESP- denies SOB, cough, wheeze ABD- denies N/V, change in stools, abd pain GU- denies dysuria, hematuria, dribbling, incontinence MSK- denies joint pain, muscle aches, injury Neuro- denies headache, dizziness, syncope, seizure activity       Objective:    BP 128/68   Pulse 96   Temp 98.1 F (36.7 C) (Oral)   Resp 16   Ht 5\' 6"  (1.676 m)   Wt 257 lb (116.6 kg)   SpO2 96%   BMI 41.48 kg/m  GEN- NAD, alert and oriented x3,obese HEENT- PERRL, EOMI, non injected sclera, pink conjunctiva, MMM, oropharynx clear Neck- Supple, no thyromegaly CVS- RRR, no murmur RESP-CTAB EXT- No edema Pulses- Radial 2+        Assessment & Plan:   Pt shown how to inject medication in office    Problem List Items Addressed This Visit      Unprioritized   Essential hypertension - Primary    Well controlled no change to norvasc      Relevant Orders   Comprehensive metabolic panel   CBC with Differential/Platelet   Obesity    Will try Saxenda, titrate to up to 3mg  daily Discussed SE of medication Should not cause any palpitations, mood swings Check labs today Recheck weight in 8 weeks       Relevant Medications   Liraglutide -Weight Management (SAXENDA) 18  MG/3ML SOPN      Note: This dictation was prepared with Dragon dictation along with smaller phrase technology. Any transcriptional errors that result from this process are unintentional.

## 2019-04-01 NOTE — Assessment & Plan Note (Signed)
Well controlled no change to norvasc

## 2019-04-01 NOTE — Assessment & Plan Note (Signed)
Will try Saxenda, titrate to up to 3mg  daily Discussed SE of medication Should not cause any palpitations, mood swings Check labs today Recheck weight in 8 weeks

## 2019-04-01 NOTE — Patient Instructions (Addendum)
Start saxenda:  Week 1: 0.6mg injected daily:   Week 2:  1.2 mg injected daily  Week 3:  1.8mg injected daily  Week 4:  2.4mg injected daily  Week 5:  3mg injected daily F/U 2 months  

## 2019-04-02 LAB — CBC WITH DIFFERENTIAL/PLATELET
Absolute Monocytes: 391 cells/uL (ref 200–950)
Basophils Absolute: 47 cells/uL (ref 0–200)
Basophils Relative: 0.5 %
Eosinophils Absolute: 140 cells/uL (ref 15–500)
Eosinophils Relative: 1.5 %
HCT: 42.5 % (ref 38.5–50.0)
Hemoglobin: 14.3 g/dL (ref 13.2–17.1)
Lymphs Abs: 2548 cells/uL (ref 850–3900)
MCH: 28.4 pg (ref 27.0–33.0)
MCHC: 33.6 g/dL (ref 32.0–36.0)
MCV: 84.3 fL (ref 80.0–100.0)
MPV: 12.6 fL — ABNORMAL HIGH (ref 7.5–12.5)
Monocytes Relative: 4.2 %
Neutro Abs: 6175 cells/uL (ref 1500–7800)
Neutrophils Relative %: 66.4 %
Platelets: 206 10*3/uL (ref 140–400)
RBC: 5.04 10*6/uL (ref 4.20–5.80)
RDW: 13 % (ref 11.0–15.0)
Total Lymphocyte: 27.4 %
WBC: 9.3 10*3/uL (ref 3.8–10.8)

## 2019-04-02 LAB — COMPREHENSIVE METABOLIC PANEL
AG Ratio: 1.5 (calc) (ref 1.0–2.5)
ALT: 21 U/L (ref 9–46)
AST: 15 U/L (ref 10–40)
Albumin: 4.5 g/dL (ref 3.6–5.1)
Alkaline phosphatase (APISO): 74 U/L (ref 36–130)
BUN: 10 mg/dL (ref 7–25)
CO2: 26 mmol/L (ref 20–32)
Calcium: 9.8 mg/dL (ref 8.6–10.3)
Chloride: 104 mmol/L (ref 98–110)
Creat: 0.93 mg/dL (ref 0.60–1.35)
Globulin: 3.1 g/dL (calc) (ref 1.9–3.7)
Glucose, Bld: 84 mg/dL (ref 65–99)
Potassium: 4.5 mmol/L (ref 3.5–5.3)
Sodium: 140 mmol/L (ref 135–146)
Total Bilirubin: 0.5 mg/dL (ref 0.2–1.2)
Total Protein: 7.6 g/dL (ref 6.1–8.1)

## 2019-04-03 ENCOUNTER — Telehealth: Payer: Self-pay | Admitting: *Deleted

## 2019-04-03 MED ORDER — NALTREXONE-BUPROPION HCL ER 8-90 MG PO TB12: ORAL_TABLET | ORAL | 0 refills | Status: DC

## 2019-04-03 NOTE — Telephone Encounter (Signed)
Call placed to pharmacy. Reports that Contrave is also a plan exclusion.   MD please advise.

## 2019-04-03 NOTE — Telephone Encounter (Signed)
Received call from pharmacy.   Reports that Kyle Mays is not covered and is a plan exclusion.   MD please advise.

## 2019-04-03 NOTE — Telephone Encounter (Signed)
Prescription sent to pharmacy. Will call pharmacy to F/U.

## 2019-04-03 NOTE — Telephone Encounter (Signed)
See if Contrave is covered

## 2019-04-03 NOTE — Telephone Encounter (Signed)
unfortunately his insurance does not cover either weight loss medication He can be seen by dietician again

## 2019-04-04 NOTE — Telephone Encounter (Signed)
Call placed to patient and patient made aware.   Agreeable to referral.   Orders placed.

## 2019-04-22 ENCOUNTER — Other Ambulatory Visit: Payer: Self-pay | Admitting: Family Medicine

## 2019-04-22 NOTE — Telephone Encounter (Signed)
Ok to refill??  Last office visit 04/01/2019.  Last refill 01/24/2019, #2 refills.

## 2019-04-24 ENCOUNTER — Telehealth: Payer: Self-pay | Admitting: *Deleted

## 2019-04-24 NOTE — Telephone Encounter (Signed)
Received call from patient.   Inquired as to if he can resume phentermine as Kirke Shaggy is not covered by insurance.   MD please advise.

## 2019-04-24 NOTE — Telephone Encounter (Signed)
I dont recommend restarting due tot he side effects

## 2019-04-25 NOTE — Telephone Encounter (Signed)
Call placed to patient. No answer. No VM.  

## 2019-04-26 NOTE — Telephone Encounter (Signed)
Call placed to patient and patient made aware.  

## 2019-05-01 ENCOUNTER — Encounter: Payer: Self-pay | Admitting: Nutrition

## 2019-05-01 ENCOUNTER — Encounter: Payer: BLUE CROSS/BLUE SHIELD | Attending: Family Medicine | Admitting: Nutrition

## 2019-05-01 ENCOUNTER — Other Ambulatory Visit: Payer: Self-pay

## 2019-05-01 NOTE — Progress Notes (Signed)
  Medical Nutrition Therapy:  Appt start time: 1400 end time:  1500.   Assessment:  Primary concerns today: Obesity, BMI > 40. Lives with his wife. She does the cooking and shopping. Eats mostly 2 meals per day. Usually Skips breakfast. He use to weigh over 300 lbs a year ago. He was trying to eat only protein and lost about 30 lbs. Cut out sodas.  Diet is high in sodium and fat and low in fresh fruits and vegetables. Diet is poor in fiber and heart healthy foods.  He is willing to make changes with diet and exercise. 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 PMH: HTN.  Preferred Learning Style:    No preference indicated   Learning Readiness:    Ready  Change in progress   MEDICATIONS:   DIETARY INTAKE:  24-hr recall:  B ( AM): skipped  Snk ( AM):   L ( PM): Jerky and popcorn, water Snk ( PM) D ( PM): 2 slices pizza , water Snk ( PM): Beverages: water,   Usual physical activity: ADL   Estimated energy needs: 1800 calories 200 g carbohydrates 135 g protein 50 g fat  Progress Towards Goal(s):  In progress.   Nutritional Diagnosis:  NB-1.1 Food and nutrition-related knowledge deficit As related to Obesity.  As evidenced by BMI 43.    Intervention:  Nutrition and weight loss education provided on My Plate, CHO counting, meal planning, portion sizes, timing of meals, avoiding snacks between meals  taking medications as prescribed, benefits of exercising 60 minutes per day and prevention of DM. Low Salt High Fiber Diet Weight loss tips.   Goals Follow My Plate Increase fresh fruits and vegetables Limit sodium to 2000 mg per day Increase high fiber foods Eat breakfast daily. Choose healthier foods for snacks Avoid processed high fat  High salt foods Lose 1-2 lbs per week. .  Teaching Method Utilized:  Visual Auditory Hands on  Handouts given during visit include:  The Plate  Method  Meal Plan  Low Sodium  Weight loss tips    Barriers to learning/adherence to lifestyle change: none  Demonstrated degree of understanding via:  Teach Back   Monitoring/Evaluation:  Dietary intake, exercise,  and body weight in 1 month(s).

## 2019-05-01 NOTE — Patient Instructions (Signed)
  Goals Follow My Plate Increase fresh fruits and vegetables Limit sodium to 2000 mg per day Increase high fiber foods Eat breakfast daily. Choose healthier foods for snacks Avoid processed high fat  High salt foods Lose 1-2 lbs per week.

## 2019-05-29 ENCOUNTER — Encounter: Payer: BC Managed Care – PPO | Admitting: Nutrition

## 2019-06-26 ENCOUNTER — Other Ambulatory Visit: Payer: Self-pay | Admitting: Family Medicine

## 2019-06-26 DIAGNOSIS — I10 Essential (primary) hypertension: Secondary | ICD-10-CM

## 2019-07-12 ENCOUNTER — Other Ambulatory Visit: Payer: Self-pay

## 2019-07-12 ENCOUNTER — Ambulatory Visit (INDEPENDENT_AMBULATORY_CARE_PROVIDER_SITE_OTHER): Payer: BC Managed Care – PPO | Admitting: Family Medicine

## 2019-07-12 DIAGNOSIS — Z20822 Contact with and (suspected) exposure to covid-19: Secondary | ICD-10-CM

## 2019-07-12 DIAGNOSIS — R6889 Other general symptoms and signs: Secondary | ICD-10-CM

## 2019-07-12 DIAGNOSIS — Z20828 Contact with and (suspected) exposure to other viral communicable diseases: Secondary | ICD-10-CM | POA: Diagnosis not present

## 2019-07-12 MED ORDER — AMOXICILLIN 875 MG PO TABS: 875.0000 mg | ORAL_TABLET | Freq: Two times a day (BID) | ORAL | 0 refills | Status: DC
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
## 2019-07-13 LAB — NOVEL CORONAVIRUS, NAA: SARS-CoV-2, NAA: NOT DETECTED

## 2019-07-19 ENCOUNTER — Encounter: Payer: Self-pay | Admitting: Family Medicine

## 2019-07-19 ENCOUNTER — Ambulatory Visit: Payer: BC Managed Care – PPO | Admitting: Family Medicine

## 2019-07-19 ENCOUNTER — Other Ambulatory Visit: Payer: Self-pay

## 2019-07-19 VITALS — BP 134/86 | HR 94 | Temp 98.9°F | Resp 16 | Ht 66.0 in | Wt 279.0 lb

## 2019-07-19 DIAGNOSIS — Z6841 Body Mass Index (BMI) 40.0 and over, adult: Secondary | ICD-10-CM | POA: Diagnosis not present

## 2019-07-19 DIAGNOSIS — I10 Essential (primary) hypertension: Secondary | ICD-10-CM

## 2019-07-19 DIAGNOSIS — F411 Generalized anxiety disorder: Secondary | ICD-10-CM

## 2019-07-19 MED ORDER — BUPROPION HCL ER (XL) 150 MG PO TB24: 150.0000 mg | ORAL_TABLET | Freq: Every day | ORAL | 1 refills | Status: DC

## 2019-07-19 MED ORDER — PHENTERMINE HCL 15 MG PO CAPS: 15.0000 mg | ORAL_CAPSULE | ORAL | 1 refills | Status: DC

## 2019-07-19 NOTE — Patient Instructions (Addendum)
F/u 6 weeks 

## 2019-07-19 NOTE — Progress Notes (Signed)
   Subjective:    Patient ID: Kyle Mays, male    DOB: July 17, 1981, 38 y.o.   MRN: [id number]  Patient presents for Weight Gain and Decreasesd Libido  Pt here to discuss weight gain, he has been stress eating a lot, wants to try medications again. When he stress eats he gets depressed and then doesn't want to eat, he had put on at least 25-30lbs since our last visit. His insurance would not cover Saxenda or Contrave  He was on phentermine last dose 1/2 of the 37.5mg  tab, he would get some palpitations, and dizzy spells other times didn't, but then he admits he was not taking consistently would skip around on it. He wants to try it again and be consistent as it did work well for his weight loss  He has also notices problems with his sex drive, he can get an erection but just doesn't have any interest and this has been going on since his stress eating His job has him traveling, he is now in management and with [place] things are very stressful in general     Review Of Systems:  GEN- denies fatigue, fever, weight loss,weakness, recent illness HEENT- denies eye drainage, change in vision, nasal discharge, CVS- denies chest pain, palpitations RESP- denies SOB, cough, wheeze ABD- denies N/V, change in stools, abd pain GU- denies dysuria, hematuria, dribbling, incontinence MSK- denies joint pain, muscle aches, injury Neuro- denies headache, dizziness, syncope, seizure activity       Objective:    BP 134/86   Pulse 94   Temp 98.9 F (37.2 C) (Oral)   Resp 16   Ht 5\' 6"  (1.676 m)   Wt 279 lb (126.6 kg)   SpO2 97%   BMI 45.03 kg/m  GEN- NAD, alert and oriented x3,obese HEENT- PERRL, EOMI, non injected sclera, pink conjunctiva, MMM, oropharynx clear Neck- Supple, no thyromegaly CVS- RRR, no murmur RESP-CTAB Psych- stressed, not anxious, no SI, well groomed, good eye contact EXT- No edema Pulses- Radial 2+        Assessment & Plan:      Problem List Items Addressed This  Visit      Unprioritized   Essential hypertension - Primary    BP looks okay no change to meds      Generalized anxiety disorder    Anxiety and some depression symptoms centered around his job and his weight He does have prn xanax it helps him sleep Will add wellbutrin for his stress and energy levels while assisting with weight loss  I expect once mood improves his libido will improve       Relevant Medications   buPROPion (WELLBUTRIN XL) 150 MG 24 hr tablet   Obesity    After further discussion, he never tried the lower phentermine 15mg  dose, will try this, he will monitor BP and HR, if any problems will discontinue and unfortunately insurance will not cover any of the other meds Recheck in a few weeks      Relevant Medications   phentermine 15 MG capsule      Note: This dictation was prepared with Dragon dictation along with smaller phrase technology. Any transcriptional errors that result from this process are unintentional.

## 2019-07-21 ENCOUNTER — Encounter: Payer: Self-pay | Admitting: Family Medicine

## 2019-07-21 NOTE — Assessment & Plan Note (Signed)
Anxiety and some depression symptoms centered around his job and his weight He does have prn xanax it helps him sleep Will add wellbutrin for his stress and energy levels while assisting with weight loss  I expect once mood improves his libido will improve

## 2019-07-21 NOTE — Assessment & Plan Note (Signed)
BP looks okay no change to meds 

## 2019-07-21 NOTE — Assessment & Plan Note (Signed)
After further discussion, he never tried the lower phentermine 15mg  dose, will try this, he will monitor BP and HR, if any problems will discontinue and unfortunately insurance will not cover any of the other meds Recheck in a few weeks

## 2019-07-23 ENCOUNTER — Other Ambulatory Visit: Payer: Self-pay | Admitting: Family Medicine

## 2019-07-24 NOTE — Telephone Encounter (Signed)
Requested Prescriptions   Pending Prescriptions Disp Refills  . ALPRAZolam (XANAX) 1 MG tablet [Pharmacy Med Name: ALPRAZOLAM 1 MG TAB] 30 tablet 0    Sig: TAKE ONE TABLET DAILY AT BEDTIME     Last OV 07/19/2019  Last written 04/22/2019

## 2019-08-07 ENCOUNTER — Encounter: Payer: Self-pay | Admitting: Family Medicine

## 2019-08-09 ENCOUNTER — Encounter: Payer: Self-pay | Admitting: Family Medicine

## 2019-08-09 ENCOUNTER — Other Ambulatory Visit: Payer: Self-pay

## 2019-08-09 ENCOUNTER — Ambulatory Visit: Payer: BC Managed Care – PPO | Admitting: Family Medicine

## 2019-08-09 DIAGNOSIS — G4700 Insomnia, unspecified: Secondary | ICD-10-CM | POA: Diagnosis not present

## 2019-08-09 DIAGNOSIS — F411 Generalized anxiety disorder: Secondary | ICD-10-CM | POA: Diagnosis not present

## 2019-08-09 MED ORDER — FLUOXETINE HCL 20 MG PO TABS: 20.0000 mg | ORAL_TABLET | Freq: Every day | ORAL | 3 refills | Status: DC

## 2019-08-09 NOTE — Assessment & Plan Note (Signed)
Continue prn xanax 

## 2019-08-09 NOTE — Patient Instructions (Addendum)
Start prozac 20mg  once a day  Stop the wellbutrin  Continue xanax Change f/u to telephone

## 2019-08-09 NOTE — Assessment & Plan Note (Addendum)
Will d/c WELLBUTRIN , start Prozac 20mg  once a day This should help any depression and anxiety symptoms and his binge eating episodes that occur in the evening  We discussed therapy, will hold off at this time  F/u via phone in 2 weeks

## 2019-08-09 NOTE — Progress Notes (Signed)
   Subjective:    Patient ID: Kyle Mays, male    DOB: 1981-05-12, 38 y.o.   MRN: [id number]  Patient presents for Depreession (feels like Wellbutrin is not helping, but making anxiety worse)   Pt here to f/u Wellbutrin. He has been having ill side effects since being on the medicine. His anxiety worsened over the past 2 weeks. He is up late at night thinking about everything.  He is rehashing work conversations, and seems paranoid about what he may have said or how it was interpreted. His boss noticed he was staying to himself, he admits that he was avoiding conversations with people on the job due to his anxiety. His appetite has been off as well, he wont eat until around lunch time, then binge eats after dinner, feels like he cant control it.  He denies any vivid dreams, no SI.  He is willing to try a different medication He did stop wellbutrin yesterday, feels less anxious and jittery   He has not noticed any SE with phentermine he started this 3 days after the wellbutrin   Review Of Systems:  GEN- denies fatigue, fever, weight loss,weakness, recent illness HEENT- denies eye drainage, change in vision, nasal discharge, CVS- denies chest pain, palpitations RESP- denies SOB, cough, wheeze ABD- denies N/V, change in stools, abd pain GU- denies dysuria, hematuria, dribbling, incontinence MSK- denies joint pain, muscle aches, injury Neuro- denies headache, dizziness, syncope, seizure activity       Objective:    BP 138/68   Pulse 96   Temp 97.9 F (36.6 C) (Temporal)   Resp 14   Ht 5\' 6"  (1.676 m)   Wt 281 lb (127.5 kg)   SpO2 96%   BMI 45.35 kg/m  GEN- NAD, alert and oriented x3 CVS- RRR, no murmur RESP-CTAB Psych- anxious appearing, shaking his left leg most of visit, no other tics noted, normal speech, very polite, not depressed appearing        Assessment & Plan:      Problem List Items Addressed This Visit      Unprioritized   Generalized anxiety  disorder    Will d/c WELLBUTRIN , start Prozac 20mg  once a day This should help any depression and anxiety symptoms and his binge eating episodes that occur in the evening  We discussed therapy, will hold off at this time  F/u via phone in 2 weeks       Relevant Medications   FLUoxetine (PROZAC) 20 MG tablet   Insomnia    Continue prn xanax          Note: This dictation was prepared with Dragon dictation along with smaller phrase technology. Any transcriptional errors that result from this process are unintentional.

## 2019-08-23 ENCOUNTER — Telehealth (INDEPENDENT_AMBULATORY_CARE_PROVIDER_SITE_OTHER): Payer: BC Managed Care – PPO | Admitting: Family Medicine

## 2019-08-23 DIAGNOSIS — Z6841 Body Mass Index (BMI) 40.0 and over, adult: Secondary | ICD-10-CM

## 2019-08-23 DIAGNOSIS — F411 Generalized anxiety disorder: Secondary | ICD-10-CM | POA: Diagnosis not present

## 2019-08-23 NOTE — Progress Notes (Signed)
Virtual Visit via Video Note  I connected with Kyle Mays on 08/25/19 at  4:00 PM EST by a video enabled telemedicine application and verified that I am speaking with the correct person using two identifiers.      Pt location: at home   Physician location:  In office, [facility], Vic Blackbird MD     On call: patient and physician   I discussed the limitations of evaluation and management by telemedicine and the availability of in person appointments. The patient expressed understanding and agreed to proceed.  History of Present Illness:  Telehealth visit to f/u prozac. Stated 2 weeks ago, after taken off wellbutrin. He feels it is working very well for him. He does not feel anxious, his family and coworkers can tell a difference. He feels he can control the overeating/binging more than before as well Sleep is good He has not had any SE with the medication  Phentermine- he has been taking at 4:30am so it wears off by 2-3pm. He has been trying to eat very small portions. States today he only had beef jerky. He had gained about 6lbs, but since Sunday is back down those 6lbs. He wanted to know if he can change the time of day he is taking his phentermine to help with evening crash when he gets very hungry    Observations/Objective: Video visit- pt sitting in his truck. NAD noted, normal WOB noted, normal speech, obese   Assessment and Plan:  GAD- continue prozac at 20mg  once a day, will f/u in another 6 weeks   Morbid obesity- he is taking phentermine quite early, though he is tolerating this low dose. Will have him move to around 9am, see if this is better timing for him. He typically has a small break around that time, but he does admit he days are not very consistent so we will see how the trial goes.  He is back on his previous eating plan, adivsed he does need more calories he is just eating handful of jerky for breakfast and lunch, fear of overeating. Advised to  stick with high protein foods/veggie/protein shakes, this will also help sustain his energy working long hours   Follow Up Instructions:  Jan 8th 3:30pm    I discussed the assessment and treatment plan with the patient. The patient was provided an opportunity to ask questions and all were answered. The patient agreed with the plan and demonstrated an understanding of the instructions.   The patient was advised to call back or seek an in-person evaluation if the symptoms worsen or if the condition fails to improve as anticipated.  I provided 12 minutes of non-face-to-face time during this encounter. End time 4:12pm  Vic Blackbird, MD

## 2019-08-25 ENCOUNTER — Encounter: Payer: Self-pay | Admitting: Family Medicine

## 2019-08-26 ENCOUNTER — Other Ambulatory Visit: Payer: Self-pay | Admitting: Family Medicine

## 2019-08-26 NOTE — Telephone Encounter (Signed)
Ok to refill??  Last office visit 08/23/2019.  Last refill 07/19/2019.

## 2019-09-24 ENCOUNTER — Other Ambulatory Visit: Payer: Self-pay | Admitting: Family Medicine

## 2019-09-24 DIAGNOSIS — I10 Essential (primary) hypertension: Secondary | ICD-10-CM

## 2019-10-11 ENCOUNTER — Ambulatory Visit: Payer: Self-pay | Admitting: Family Medicine

## 2019-10-14 ENCOUNTER — Other Ambulatory Visit: Payer: Self-pay | Admitting: Family Medicine

## 2019-10-14 NOTE — Telephone Encounter (Signed)
Ok to refill??  Last office visit 08/09/2019.  Last refill 07/24/2019, #2 refills.

## 2019-10-23 ENCOUNTER — Other Ambulatory Visit: Payer: Self-pay | Admitting: Family Medicine

## 2019-11-11 ENCOUNTER — Other Ambulatory Visit: Payer: Self-pay | Admitting: Family Medicine

## 2019-11-11 NOTE — Telephone Encounter (Signed)
Ok to refill??  Last office visit 08/09/2019.  Last refill 08/26/2019, #1 refill.

## 2019-11-20 ENCOUNTER — Other Ambulatory Visit: Payer: Self-pay | Admitting: Family Medicine

## 2019-11-21 ENCOUNTER — Other Ambulatory Visit: Payer: Self-pay

## 2019-11-21 DIAGNOSIS — L309 Dermatitis, unspecified: Secondary | ICD-10-CM

## 2019-11-22 MED ORDER — TRIAMCINOLONE ACETONIDE 0.5 % EX CREA: TOPICAL_CREAM | Freq: Three times a day (TID) | CUTANEOUS | 3 refills | Status: DC

## 2019-12-19 ENCOUNTER — Other Ambulatory Visit: Payer: Self-pay | Admitting: Family Medicine

## 2019-12-19 DIAGNOSIS — I10 Essential (primary) hypertension: Secondary | ICD-10-CM

## 2020-01-06 ENCOUNTER — Other Ambulatory Visit: Payer: Self-pay

## 2020-01-06 ENCOUNTER — Ambulatory Visit: Payer: BC Managed Care – PPO | Admitting: Nurse Practitioner

## 2020-01-06 ENCOUNTER — Other Ambulatory Visit: Payer: Self-pay | Admitting: Family Medicine

## 2020-01-06 VITALS — BP 130/90 | HR 66 | Temp 97.9°F | Resp 18 | Ht 66.0 in | Wt 315.2 lb

## 2020-01-06 DIAGNOSIS — H1013 Acute atopic conjunctivitis, bilateral: Secondary | ICD-10-CM

## 2020-01-06 DIAGNOSIS — J302 Other seasonal allergic rhinitis: Secondary | ICD-10-CM | POA: Diagnosis not present
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
## 2020-01-06 NOTE — Telephone Encounter (Signed)
Ok to refill??  Last office visit 08/09/2019.  Last refill 10/14/2019, #2 refills.

## 2020-01-07 ENCOUNTER — Encounter: Payer: Self-pay | Admitting: Family Medicine

## 2020-01-07 ENCOUNTER — Ambulatory Visit: Payer: BC Managed Care – PPO | Admitting: Family Medicine

## 2020-01-07 VITALS — BP 136/88 | HR 80 | Temp 98.3°F | Resp 16 | Ht 66.0 in | Wt 315.0 lb

## 2020-01-07 DIAGNOSIS — T7840XD Allergy, unspecified, subsequent encounter: Secondary | ICD-10-CM

## 2020-01-07 DIAGNOSIS — W57XXXD Bitten or stung by nonvenomous insect and other nonvenomous arthropods, subsequent encounter: Secondary | ICD-10-CM | POA: Diagnosis not present

## 2020-01-07 DIAGNOSIS — L239 Allergic contact dermatitis, unspecified cause: Secondary | ICD-10-CM

## 2020-01-07 MED ORDER — PREDNISONE 10 MG PO TABS: ORAL_TABLET | ORAL | 0 refills | Status: DC

## 2020-01-07 MED ORDER — DOXYCYCLINE HYCLATE 100 MG PO TABS: 100.0000 mg | ORAL_TABLET | Freq: Two times a day (BID) | ORAL | 0 refills | Status: DC

## 2020-01-07 MED ORDER — METHYLPREDNISOLONE ACETATE 80 MG/ML IJ SUSP
80.0000 mg | Freq: Once | INTRAMUSCULAR | Status: AC
  Administered 2020-01-07: 80 mg via INTRAMUSCULAR

## 2020-01-07 NOTE — Patient Instructions (Addendum)
Take benadryl when you get home Take the prednisone 1st dose around 5pm Then take in the morning going forward Start antibiotics  F/U in the morning 8 AM - double book

## 2020-01-07 NOTE — Progress Notes (Signed)
Subjective:    Patient ID: Kyle Mays, male    DOB: [birth year], 39 y.o.   MRN: [id number]  Patient presents for Allergic Reaction (eye swelling, irritation to arms, mouth, does have insect bite to R inner arm) Patient here with ongoing eye irritation and rash.  He was seen yesterday secondary to bilateral eye redness and swelling.  He also had a bug bite on the arm and other symptoms include runny nose but no cough congestion fever GI symptoms.  He was diagnosed with allergic conjunctivitis of both eyes and seasonal allergies.  Advised these oral antihistamine and apply cool cloths to the eyes 3-4 times a day.  Does state before his symptoms started he was clearing some brush and trees outside and then he noticed the itching.  His wife today has a small patch of redness and itching on her arm she was also helping him.  He is not sure if there was poison ivy sumac out there.  For the past 24 hours he has had worsening swelling of the face bilateral eyes now down to the cheeks and chin his face feels very tight and he has itching.  He also has a rash on bilateral upper arms.  The bug bite on his right upper arm had a red ring around it yesterday but that has gone down some.  There was also some clear to yellow drainage from the bite.  He has not had any fever no difficulty breathing no difficulty swallowing   Review Of Systems:  GEN- denies fatigue, fever, weight loss,weakness, recent illness HEENT- denies eye drainage, change in vision, nasal discharge, CVS- denies chest pain, palpitations RESP- denies SOB, cough, wheeze ABD- denies N/V, change in stools, abd pain GU- denies dysuria, hematuria, dribbling, incontinence MSK- denies joint pain, muscle aches, injury Neuro- denies headache, dizziness, syncope, seizure activity       Objective:    BP 136/88   Pulse 80   Temp 98.3 F (36.8 C) (Temporal)   Resp 16   Ht 5\' 6"  (1.676 m)   Wt (!) 315 lb (142.9 kg)   SpO2 97%   BMI 50.84  kg/m  GEN- NAD, alert and oriented x3 HEENT- PERRL, EOMI, non injected sclera, pink conjunctiva, MMM, oropharynx clear uvula midline swelling of the upper lids bilaterally with erythema right greater than left he is able to open though right eye but there is significant swelling.  Erythema and swelling tracked down to bilateral cheeks and the chin region but nontender to palpation no discrete abscess.  No pain with extraocular movements. Neck- Supple, no lymphadenopathy CVS- RRR, no murmur RESP-CTAB Skin erythematous patch with fine maculopapular lesions the right upper arm also bug bite lesion no fluctuance right upper arm.  Similar patch of erythema in the left arm. EXT- No edema Pulses- Radial, DP- 2+        Assessment & Plan:      Problem List Items Addressed This Visit    None    Visit Diagnoses    Allergic reaction Dermatitis, subsequent encounter    -  Primary   My differentials include allergic dermatitis secondary to poison sumac IVy or oak dermatitis.  Or other plant allergen.  Other possibility is the bug bite unclear if this is a spider bite or other insect but he does have significant erythema and has some drainage from this lesion.  Does not appear to have been a tick bite.  As his symptoms are progressive especially with the  swelling of the right eye and upper lid I have given him Depo-Medrol 80 mg in the office.  He will take another Benadryl when he gets home his son is driving him.  He is not having any respiratory compromise at this time.  He will use cool compresses to the skin.  I will also cover for infection related to the insect bite with doxycycline 100 mg twice daily.  Advised patient to take his next dose of high-dose steroid around 4 -5 PM this afternoon.  If for some reason tonight his swelling progresses despite our treatment he is to go to the local emergency room.  If his swelling is actually improving as well as the itching I will see him back first thing in  the morning for recheck   Relevant Medications   methylPREDNISolone acetate (DEPO-MEDROL) injection 80 mg (Completed)   Bug bite with infection, subsequent encounter          Note: This dictation was prepared with Dragon dictation along with smaller phrase technology. Any transcriptional errors that result from this process are unintentional.

## 2020-01-08 ENCOUNTER — Other Ambulatory Visit: Payer: Self-pay

## 2020-01-08 ENCOUNTER — Ambulatory Visit: Payer: BC Managed Care – PPO | Admitting: Family Medicine

## 2020-01-08 ENCOUNTER — Encounter: Payer: Self-pay | Admitting: Family Medicine

## 2020-01-08 VITALS — BP 132/80 | HR 80 | Temp 98.1°F | Resp 14 | Ht 66.0 in | Wt 308.0 lb

## 2020-01-08 DIAGNOSIS — W57XXXD Bitten or stung by nonvenomous insect and other nonvenomous arthropods, subsequent encounter: Secondary | ICD-10-CM

## 2020-01-08 DIAGNOSIS — L239 Allergic contact dermatitis, unspecified cause: Secondary | ICD-10-CM

## 2020-01-08 NOTE — Patient Instructions (Addendum)
F/U 1 month

## 2020-01-08 NOTE — Progress Notes (Signed)
   Subjective:    Patient ID: Kyle Mays, male    DOB: 03-May-1981, 39 y.o.   MRN: [id number]  Patient presents for Follow-up (allergic reaction)  Patient here for 24-hour interim follow-up on allergic dermatitis of his face and skin along with concern for infected bug bite on his right upper arm.  He was started on steroids yesterday.  He received Depo-Medrol 80 mg in the office and then started his prednisone taper yesterday evening along with doxycycline antibiotic. Denies any difficulty breathing , no fever since yesterday. Today he states that the swelling has gone down in his right eye and his face.  However he has some new spots that look like tiny blisters in a line on his right forearm and on the hand and the rash on his left upper arm has extended a little.  Benadryl did help with the itching last night and also helped him sleep Bug bites has gone down  Review Of Systems:  GEN- denies fatigue, fever, weight loss,weakness, recent illness HEENT- denies eye drainage, change in vision, nasal discharge, CVS- denies chest pain, palpitations RESP- denies SOB, cough, wheeze ABD- denies N/V, change in stools, abd pain Neuro- denies headache, dizziness, syncope, seizure activity       Objective:    BP 132/80   Pulse 80   Temp 98.1 F (36.7 C) (Temporal)   Resp 14   Ht 5\' 6"  (1.676 m)   Wt (!) 308 lb (139.7 kg)   SpO2 98%   BMI 49.71 kg/m  GEN- NAD, alert and oriented x3 HEENT- PERRL, EOMI, non injected sclera, pink conjunctiva, MMM, oropharynx clear uvula midline decreased  swelling of the upper lids bilaterally with decreased erythema Bilat. Able to open and close eyes   decreased Erythema and swelling trackig down to bilateral cheeks and the chin region but nontender to palpation no discrete abscess.  No pain with extraocular movements. Neck- Supple, no lymphadenopathy CVS- RRR, no murmur RESP-CTAB Skin decreased erythematous patch with fine maculopapular lesions the right  upper arm also bug bite lesion no fluctuance right upper arm.  Similar patch of erythema in the left arm extending to forearm, 2  linear fine blistering vesicules on right forearm and on right hand ventral surface  EXT- No edema Pulses- Radial, DP- 2+    Assessment & Plan:      Problem List Items Addressed This Visit    None    Visit Diagnoses    Allergic dermatitis    -  Primary   Based appearance today of new lesions this was a posion ivy/sumac dermatitis exposure. Improved facial symptoms. Continue prednisone taper, benadryl, also has a topical steroid that he can apply to the arm for the itching.  The bug bite has also decreased there is no fluctuance and no drainage today.  Also have him complete the doxycycline as well as he did have significant improvement overnight with the combination of medication.  He will be given the rest of the week off as he works outside in the sun I think that this will exacerbate the rash and irritate his eyes especially with sweating and the heat   Bug bite with infection, subsequent encounter          Note: This dictation was prepared with Dragon dictation along with smaller phrase technology. Any transcriptional errors that result from this process are unintentional.

## 2020-01-29 ENCOUNTER — Other Ambulatory Visit: Payer: Self-pay | Admitting: Family Medicine

## 2020-01-30 NOTE — Telephone Encounter (Signed)
Ok to refill Phentermine??  Last office visit 01/08/2020.  Last refill 11/11/2019, #1 refill.

## 2020-02-05 ENCOUNTER — Other Ambulatory Visit: Payer: Self-pay

## 2020-02-05 ENCOUNTER — Ambulatory Visit: Payer: BC Managed Care – PPO | Admitting: Family Medicine

## 2020-02-05 ENCOUNTER — Encounter: Payer: Self-pay | Admitting: Family Medicine

## 2020-02-05 VITALS — BP 130/74 | HR 90 | Temp 98.4°F | Resp 14 | Ht 66.0 in | Wt 310.0 lb

## 2020-02-05 DIAGNOSIS — E8881 Metabolic syndrome: Secondary | ICD-10-CM | POA: Insufficient documentation

## 2020-02-05 DIAGNOSIS — K219 Gastro-esophageal reflux disease without esophagitis: Secondary | ICD-10-CM | POA: Diagnosis not present

## 2020-02-05 DIAGNOSIS — Z6841 Body Mass Index (BMI) 40.0 and over, adult: Secondary | ICD-10-CM

## 2020-02-05 DIAGNOSIS — F411 Generalized anxiety disorder: Secondary | ICD-10-CM

## 2020-02-05 DIAGNOSIS — I10 Essential (primary) hypertension: Secondary | ICD-10-CM | POA: Diagnosis not present

## 2020-02-05 NOTE — Assessment & Plan Note (Signed)
Controlled with norvasc 

## 2020-02-05 NOTE — Progress Notes (Signed)
   Subjective:    Patient ID: Kyle Mays, male    DOB: [birth year], 39 y.o.   MRN: [id number]  Patient presents for Follow-up (is not fasting)  Patient here for intermittent follow-up.  He was seen 1 month ago after he had allergic reaction for today's follow-up visit is for his medications. He is currently taking fluoxetine 20 mg once a day as well as alprazolam as needed for anxiety and depressive symptoms.  Acid reflux he takes omeprazole daily  Pretension amlodipine 5 mg once a day  Class III obesity he was tried on Saxenda but insurance would not cover. Marland Kitchen  He was then s  he is went back to phentermine, took around 9am for a while but appetite would just surge back, had palpitations with higher doses of phentermine, so he ended up just stopping meds as he was still gaining weight  He is willing to try something else   He has been trying to increase his protein with each meal  decreasing   Weight today 310  No soda for 2 years, drinks coffee black occ honey, recently cut out sweet tea  His brother, parents are all overweight  Review Of Systems:  GEN- denies fatigue, fever, weight loss,weakness, recent illness HEENT- denies eye drainage, change in vision, nasal discharge, CVS- denies chest pain, palpitations RESP- denies SOB, cough, wheeze ABD- denies N/V, change in stools, abd pain GU- denies dysuria, hematuria, dribbling, incontinence MSK- denies joint pain, muscle aches, injury Neuro- denies headache, dizziness, syncope, seizure activity       Objective:    BP 130/74   Pulse 90   Temp 98.4 F (36.9 C) (Temporal)   Resp 14   Ht 5\' 6"  (1.676 m)   Wt (!) 310 lb (140.6 kg)   SpO2 94%   BMI 50.04 kg/m  GEN- NAD, alert and oriented x3 HEENT- PERRL, EOMI, non injected sclera, pink conjunctiva, MMM, oropharynx clear CVS- RRR, no murmur RESP-CTAB ABD-NABS,soft,NT,ND Psych- normal affect and mood  EXT- No edema Pulses- Radial2+        Assessment & Plan:       Problem List Items Addressed This Visit      Unprioritized   Essential hypertension - Primary    Controlled with norvasc       Relevant Orders   CBC with Differential/Platelet   Comprehensive metabolic panel   Hemoglobin A1c   Generalized anxiety disorder    Doing well on prozac prn xanax       GERD (gastroesophageal reflux disease)   Metabolic syndrome    He would benefit from weight loss meds Check A1C today  I think I will plan to try another GLP-1 therapy if we can get covered such as Ozempic  Or victoza      Obesity   Relevant Orders   Hemoglobin A1c   Lipid panel      Note: This dictation was prepared with Dragon dictation along with smaller phrase technology. Any transcriptional errors that result from this process are unintentional.

## 2020-02-05 NOTE — Patient Instructions (Addendum)
F/u 4 months physical 

## 2020-02-05 NOTE — Assessment & Plan Note (Signed)
He would benefit from weight loss meds Check A1C today  I think I will plan to try another GLP-1 therapy if we can get covered such as Ozempic  Or victoza

## 2020-02-05 NOTE — Assessment & Plan Note (Signed)
Doing well on prozac prn xanax

## 2020-02-06 ENCOUNTER — Encounter: Payer: Self-pay | Admitting: Family Medicine

## 2020-02-06 LAB — COMPREHENSIVE METABOLIC PANEL
AG Ratio: 1.6 (calc) (ref 1.0–2.5)
ALT: 36 U/L (ref 9–46)
AST: 19 U/L (ref 10–40)
Albumin: 4.3 g/dL (ref 3.6–5.1)
Alkaline phosphatase (APISO): 62 U/L (ref 36–130)
BUN: 10 mg/dL (ref 7–25)
CO2: 28 mmol/L (ref 20–32)
Calcium: 9.5 mg/dL (ref 8.6–10.3)
Chloride: 104 mmol/L (ref 98–110)
Creat: 0.99 mg/dL (ref 0.60–1.35)
Globulin: 2.7 g/dL (calc) (ref 1.9–3.7)
Glucose, Bld: 86 mg/dL (ref 65–99)
Potassium: 4.6 mmol/L (ref 3.5–5.3)
Sodium: 139 mmol/L (ref 135–146)
Total Bilirubin: 0.4 mg/dL (ref 0.2–1.2)
Total Protein: 7 g/dL (ref 6.1–8.1)

## 2020-02-06 LAB — CBC WITH DIFFERENTIAL/PLATELET
Absolute Monocytes: 546 cells/uL (ref 200–950)
Basophils Absolute: 42 cells/uL (ref 0–200)
Basophils Relative: 0.5 %
Eosinophils Absolute: 454 cells/uL (ref 15–500)
Eosinophils Relative: 5.4 %
HCT: 43.8 % (ref 38.5–50.0)
Hemoglobin: 14.2 g/dL (ref 13.2–17.1)
Lymphs Abs: 2150 cells/uL (ref 850–3900)
MCH: 27.9 pg (ref 27.0–33.0)
MCHC: 32.4 g/dL (ref 32.0–36.0)
MCV: 86.1 fL (ref 80.0–100.0)
MPV: 12.4 fL (ref 7.5–12.5)
Monocytes Relative: 6.5 %
Neutro Abs: 5208 cells/uL (ref 1500–7800)
Neutrophils Relative %: 62 %
Platelets: 207 10*3/uL (ref 140–400)
RBC: 5.09 10*6/uL (ref 4.20–5.80)
RDW: 13.1 % (ref 11.0–15.0)
Total Lymphocyte: 25.6 %
WBC: 8.4 10*3/uL (ref 3.8–10.8)

## 2020-02-06 LAB — LIPID PANEL
Cholesterol: 187 mg/dL (ref <200)
HDL: 46 mg/dL (ref >=40)
LDL Cholesterol (Calc): 121 mg/dL (calc) — ABNORMAL HIGH
Non-HDL Cholesterol (Calc): 141 mg/dL (calc) — ABNORMAL HIGH (ref <130)
Total CHOL/HDL Ratio: 4.1 (calc) (ref <5.0)
Triglycerides: 97 mg/dL (ref <150)

## 2020-02-06 LAB — HEMOGLOBIN A1C
Hgb A1c MFr Bld: 5.3 % of total Hgb (ref <5.7)
Mean Plasma Glucose: 105 (calc)
eAG (mmol/L): 5.8 (calc)

## 2020-02-07 ENCOUNTER — Other Ambulatory Visit: Payer: Self-pay | Admitting: *Deleted

## 2020-02-07 MED ORDER — OZEMPIC (0.25 OR 0.5 MG/DOSE) 2 MG/1.5ML ~~LOC~~ SOPN: 0.2500 mg | PEN_INJECTOR | SUBCUTANEOUS | 3 refills | Status: DC

## 2020-02-07 MED ORDER — BD PEN NEEDLE NANO 2ND GEN 32G X 4 MM MISC: 1.0000 | 11 refills | Status: DC

## 2020-02-10 ENCOUNTER — Telehealth: Payer: Self-pay | Admitting: *Deleted

## 2020-02-10 NOTE — Telephone Encounter (Signed)
Received PA determination.   PA- [id number] approved through 02/09/2021.  Pharmacy made aware.

## 2020-02-10 NOTE — Telephone Encounter (Signed)
[employer] is reviewing your PA request. Typically an electronic response will be received within 72 hours. To check for an update later, open this request from your dashboard.  You may close this dialog and return to your dashboard to perform other tasks. 

## 2020-02-10 NOTE — Telephone Encounter (Signed)
Your information has been sent to [employer].

## 2020-02-10 NOTE — Telephone Encounter (Signed)
Received request from pharmacy for PA on Ozempic.   PA submitted.   Dx: E78.81- metabolic syndrome

## 2020-03-08 ENCOUNTER — Other Ambulatory Visit: Payer: Self-pay | Admitting: Family Medicine

## 2020-03-08 DIAGNOSIS — I10 Essential (primary) hypertension: Secondary | ICD-10-CM

## 2020-03-26 ENCOUNTER — Other Ambulatory Visit: Payer: Self-pay | Admitting: Family Medicine

## 2020-03-26 NOTE — Telephone Encounter (Signed)
Ok to refill??  Last office visit 02/05/2020.  Last refill 01/06/2020, #2 refills.

## 2020-04-09 ENCOUNTER — Other Ambulatory Visit: Payer: Self-pay | Admitting: Family Medicine

## 2020-05-06 ENCOUNTER — Other Ambulatory Visit: Payer: Self-pay | Admitting: Family Medicine

## 2020-05-06 DIAGNOSIS — I10 Essential (primary) hypertension: Secondary | ICD-10-CM

## 2020-05-18 DIAGNOSIS — R0981 Nasal congestion: Secondary | ICD-10-CM | POA: Diagnosis not present

## 2020-05-18 DIAGNOSIS — R0602 Shortness of breath: Secondary | ICD-10-CM | POA: Diagnosis not present

## 2020-05-18 DIAGNOSIS — R5383 Other fatigue: Secondary | ICD-10-CM | POA: Diagnosis not present

## 2020-05-18 DIAGNOSIS — R05 Cough: Secondary | ICD-10-CM | POA: Diagnosis not present

## 2020-06-07 ENCOUNTER — Other Ambulatory Visit: Payer: Self-pay | Admitting: Family Medicine

## 2020-06-09 ENCOUNTER — Ambulatory Visit (INDEPENDENT_AMBULATORY_CARE_PROVIDER_SITE_OTHER): Payer: BC Managed Care – PPO | Admitting: Family Medicine

## 2020-06-09 ENCOUNTER — Encounter: Payer: Self-pay | Admitting: Family Medicine

## 2020-06-09 ENCOUNTER — Other Ambulatory Visit: Payer: Self-pay

## 2020-06-09 VITALS — BP 124/78 | HR 86 | Temp 98.2°F | Resp 14 | Ht 66.0 in | Wt 311.0 lb

## 2020-06-09 DIAGNOSIS — I10 Essential (primary) hypertension: Secondary | ICD-10-CM | POA: Diagnosis not present

## 2020-06-09 DIAGNOSIS — Z0001 Encounter for general adult medical examination with abnormal findings: Secondary | ICD-10-CM | POA: Diagnosis not present

## 2020-06-09 DIAGNOSIS — R6889 Other general symptoms and signs: Secondary | ICD-10-CM | POA: Diagnosis not present

## 2020-06-09 DIAGNOSIS — Z1159 Encounter for screening for other viral diseases: Secondary | ICD-10-CM

## 2020-06-09 DIAGNOSIS — Z6841 Body Mass Index (BMI) 40.0 and over, adult: Secondary | ICD-10-CM

## 2020-06-09 DIAGNOSIS — R209 Unspecified disturbances of skin sensation: Secondary | ICD-10-CM | POA: Diagnosis not present

## 2020-06-09 DIAGNOSIS — Z0000 Encounter for general adult medical examination without abnormal findings: Secondary | ICD-10-CM

## 2020-06-09 DIAGNOSIS — F411 Generalized anxiety disorder: Secondary | ICD-10-CM

## 2020-06-09 DIAGNOSIS — G4733 Obstructive sleep apnea (adult) (pediatric): Secondary | ICD-10-CM

## 2020-06-09 DIAGNOSIS — E559 Vitamin D deficiency, unspecified: Secondary | ICD-10-CM

## 2020-06-09 DIAGNOSIS — R5382 Chronic fatigue, unspecified: Secondary | ICD-10-CM

## 2020-06-09 DIAGNOSIS — E8881 Metabolic syndrome: Secondary | ICD-10-CM

## 2020-06-09 NOTE — Progress Notes (Signed)
Subjective:    Patient ID: Kyle Mays, male    DOB: 1981-04-29, 39 y.o.   MRN: [id number]  Patient presents for Annual Exam (is fasting) Patient here for complete physical exam.  Medications reviewed/history reviewed.   Immunizations-  tetanus up-to-date Discussed COVID-19 and flu vaccine  Hypertension he is taking amlodipine as prescribed without any side effects.  Generalized anxiety he maintains on Prozac 20 mg and as needed alprazolam He doesn't feel stressed  He feels fairly rested in the morning Sitting in the car, does get sleepy, but hasnt fallen asleep   Obesity with metabolic syndrome he is currently on Ozempic 1.25 mg once a week. Weight at his visit in May was 310 pounds   Obstructive  sleep apnea he continues on his CPAP therapy, he feels tired all the time, He tries to stay active, but takes some naps during the day  If he doesn't wear his mask he will notice he is gasping for air He is still paying out of pocket for CPAP therapy    Review Of Systems:  GEN- + fatigue, fever, weight loss,weakness, recent illness HEENT- denies eye drainage, change in vision, nasal discharge, CVS- denies chest pain, palpitations RESP- denies SOB, cough, wheeze ABD- denies N/V, change in stools, abd pain GU- denies dysuria, hematuria, dribbling, incontinence MSK- denies joint pain, muscle aches, injury Neuro- denies headache, dizziness, syncope, seizure activity       Objective:    BP 124/78   Pulse 86   Temp 98.2 F (36.8 C) (Temporal)   Resp 14   Ht 5\' 6"  (1.676 m)   Wt (!) 311 lb (141.1 kg)   SpO2 96%   BMI 50.20 kg/m  GEN- NAD, alert and oriented x3,OBESE  HEENT- PERRL, EOMI, non injected sclera, pink conjunctiva, MMM, oropharynx  edentoulus  Neck- Supple, no thyromegaly CVS- RRR, no murmur RESP-CTAB ABD-NABS,soft,NT,ND PSYCH- normal affect and mood  EXT- No edema Pulses- Radial, DP- 2+   FALL/ AUDIT C PHQ9 score 8, for sleep, fatigue       Assessment & Plan:      Problem List Items Addressed This Visit      Unprioritized   Essential hypertension    Controlled , no changes       Relevant Orders   Comprehensive metabolic panel   Lipid panel   TSH   Generalized anxiety disorder    Anxiety seems to be controlled Does not have depression symptoms, but more fatigue, sleep issues  No change to prozac, xanax       Metabolic syndrome   Obesity    Increase ozempic to 0.5mg  once a week After 4 weeks if hunger not controlled okay to , increase to  1mg   Discussed returning to high protein diet Reducing fast food, he admits he has been "off " his diet more than usual  He has however maintained weight over the past 4 months       Sleep apnea    Ongoing fatigue, despite CPAP Therapy Napping during the day, I think he is high risk for narcolepsy with his obesity as well  He needs to get CPAP covered through insurance as well Will set up with neurology for further evaluation of his fatigue, sleep issues  I will check metabolic panel, B12, Vitamin D , TSH as well        Other Visit Diagnoses    Routine general medical examination at a health care facility    -  Primary  CPE done, fasting labs obtained, declines COVID/FLU vaccine    Relevant Orders   CBC with Differential/Platelet   Comprehensive metabolic panel   Lipid panel   HIV Antibody (routine testing w rflx)   Hepatitis C antibody   Need for hepatitis C screening test       Vitamin D deficiency       Relevant Orders   Vitamin D, 25-hydroxy   Other general symptoms and signs       Relevant Orders   Vitamin B12      Note: This dictation was prepared with Dragon dictation along with smaller phrase technology. Any transcriptional errors that result from this process are unintentional.

## 2020-06-09 NOTE — Patient Instructions (Addendum)
We will call with lab results Take 0.5mg  once a week for 4 weeks Then go to full dose of 1mg   Referral to neurologist  F/U 3 months

## 2020-06-09 NOTE — Assessment & Plan Note (Signed)
Anxiety seems to be controlled Does not have depression symptoms, but more fatigue, sleep issues  No change to prozac, xanax

## 2020-06-09 NOTE — Assessment & Plan Note (Signed)
Controlled, no changes. 

## 2020-06-09 NOTE — Addendum Note (Signed)
Addended by: Milinda Antis F on: 06/09/2020 09:38 AM   Modules accepted: Orders

## 2020-06-09 NOTE — Assessment & Plan Note (Addendum)
Increase ozempic to 0.5mg  once a week After 4 weeks if hunger not controlled okay to , increase to  1mg   Discussed returning to high protein diet Reducing fast food, he admits he has been "off " his diet more than usual  He has however maintained weight over the past 4 months

## 2020-06-09 NOTE — Assessment & Plan Note (Signed)
Ongoing fatigue, despite CPAP Therapy Napping during the day, I think he is high risk for narcolepsy with his obesity as well  He needs to get CPAP covered through insurance as well Will set up with neurology for further evaluation of his fatigue, sleep issues  I will check metabolic panel, B12, Vitamin D , TSH as well

## 2020-06-10 LAB — HEPATITIS C ANTIBODY
Hepatitis C Ab: NONREACTIVE
SIGNAL TO CUT-OFF: 0.02 (ref <1.00)

## 2020-06-10 LAB — COMPREHENSIVE METABOLIC PANEL
AG Ratio: 1.4 (calc) (ref 1.0–2.5)
ALT: 27 U/L (ref 9–46)
AST: 18 U/L (ref 10–40)
Albumin: 4.4 g/dL (ref 3.6–5.1)
Alkaline phosphatase (APISO): 62 U/L (ref 36–130)
BUN: 11 mg/dL (ref 7–25)
CO2: 24 mmol/L (ref 20–32)
Calcium: 9.5 mg/dL (ref 8.6–10.3)
Chloride: 102 mmol/L (ref 98–110)
Creat: 0.79 mg/dL (ref 0.60–1.35)
Globulin: 3.1 g/dL (calc) (ref 1.9–3.7)
Glucose, Bld: 83 mg/dL (ref 65–99)
Potassium: 4.4 mmol/L (ref 3.5–5.3)
Sodium: 137 mmol/L (ref 135–146)
Total Bilirubin: 0.4 mg/dL (ref 0.2–1.2)
Total Protein: 7.5 g/dL (ref 6.1–8.1)

## 2020-06-10 LAB — CBC WITH DIFFERENTIAL/PLATELET
Absolute Monocytes: 408 cells/uL (ref 200–950)
Basophils Absolute: 51 cells/uL (ref 0–200)
Basophils Relative: 0.6 %
Eosinophils Absolute: 204 cells/uL (ref 15–500)
Eosinophils Relative: 2.4 %
HCT: 42.8 % (ref 38.5–50.0)
Hemoglobin: 14.1 g/dL (ref 13.2–17.1)
Lymphs Abs: 2312 cells/uL (ref 850–3900)
MCH: 28.1 pg (ref 27.0–33.0)
MCHC: 32.9 g/dL (ref 32.0–36.0)
MCV: 85.3 fL (ref 80.0–100.0)
MPV: 12.3 fL (ref 7.5–12.5)
Monocytes Relative: 4.8 %
Neutro Abs: 5525 cells/uL (ref 1500–7800)
Neutrophils Relative %: 65 %
Platelets: 237 10*3/uL (ref 140–400)
RBC: 5.02 10*6/uL (ref 4.20–5.80)
RDW: 13.3 % (ref 11.0–15.0)
Total Lymphocyte: 27.2 %
WBC: 8.5 10*3/uL (ref 3.8–10.8)

## 2020-06-10 LAB — LIPID PANEL
Cholesterol: 202 mg/dL — ABNORMAL HIGH (ref <200)
HDL: 45 mg/dL (ref >=40)
LDL Cholesterol (Calc): 124 mg/dL (calc) — ABNORMAL HIGH
Non-HDL Cholesterol (Calc): 157 mg/dL (calc) — ABNORMAL HIGH (ref <130)
Total CHOL/HDL Ratio: 4.5 (calc) (ref <5.0)
Triglycerides: 221 mg/dL — ABNORMAL HIGH (ref <150)

## 2020-06-10 LAB — VITAMIN B12: Vitamin B-12: 326 pg/mL (ref 200–1100)

## 2020-06-10 LAB — VITAMIN D 25 HYDROXY (VIT D DEFICIENCY, FRACTURES): Vit D, 25-Hydroxy: 15 ng/mL — ABNORMAL LOW (ref 30–100)

## 2020-06-10 LAB — HIV ANTIBODY (ROUTINE TESTING W REFLEX): HIV 1&2 Ab, 4th Generation: NONREACTIVE

## 2020-06-10 LAB — TSH: TSH: 2.45 mIU/L (ref 0.40–4.50)

## 2020-06-11 ENCOUNTER — Other Ambulatory Visit: Payer: Self-pay | Admitting: *Deleted

## 2020-06-11 MED ORDER — VITAMIN D (ERGOCALCIFEROL) 1.25 MG (50000 UNIT) PO CAPS: 50000.0000 [IU] | ORAL_CAPSULE | ORAL | 0 refills | Status: DC

## 2020-06-15 ENCOUNTER — Encounter: Payer: Self-pay | Admitting: Family Medicine

## 2020-06-15 ENCOUNTER — Other Ambulatory Visit: Payer: Self-pay

## 2020-06-15 ENCOUNTER — Other Ambulatory Visit: Payer: Self-pay | Admitting: Family Medicine

## 2020-06-15 ENCOUNTER — Ambulatory Visit: Payer: BC Managed Care – PPO | Admitting: Family Medicine

## 2020-06-15 VITALS — HR 90 | Temp 98.6°F | Resp 18

## 2020-06-15 DIAGNOSIS — B349 Viral infection, unspecified: Secondary | ICD-10-CM | POA: Diagnosis not present

## 2020-06-15 DIAGNOSIS — Z20822 Contact with and (suspected) exposure to covid-19: Secondary | ICD-10-CM

## 2020-06-15 NOTE — Telephone Encounter (Signed)
Ok to refill??  Last office visit 06/15/2020.  Last refill 03/27/2020, #2 refills.

## 2020-06-15 NOTE — Progress Notes (Signed)
   Subjective:    Patient ID: Kyle Mays, male    DOB: 1981-04-17, 39 y.o.   MRN: [id number]  Patient presents for URI (NEEDS covid TESTING)  Patient here with cough congestion body aches that started 24 hours ago.  His wife as well as his children have also been sick with URI symptoms.  He has also been around coworkers that are positive for Covid.  He has not been vaccinated.  He has nausea but no vomiting.  He still has taste and smell.  He has had cold chills and felt like he has been feverish.  He has been taking acetaminophen. No difficulty breathing.  Review Of Systems:  GEN- + fatigue+, fever, weight loss,weakness, recent illness HEENT- denies eye drainage, change in vision, nasal discharge, CVS- denies chest pain, palpitations RESP- denies SOB, +cough, wheeze ABD- +N/ no V, change in stools, abd pain GU- denies dysuria, hematuria, dribbling, incontinence MSK- denies joint pain, +muscle aches, injury Neuro- denies headache, dizziness, syncope, seizure activity       Objective:    Pulse 90   Temp 98.6 F (37 C) (Temporal)   Resp 18   SpO2 96%  GEN- NAD, alert and oriented x3,sick appearing - Pt examined in Car  HEENT- PERRL, EOMI, non injected sclera, nares clear rinorrhea  CVS- RRR, no murmur RESP-CTAB, normal WOB         Assessment & Plan:      Problem List Items Addressed This Visit    None    Visit Diagnoses    Encounter for laboratory testing for COVID-19 virus    -  Primary   Relevant Orders   SARS-COV-2 RNA,(COVID-19) QUAL NAAT   Viral illness       Symptoms concerning for COVID- testing done, quarentine, OTC fever reduce, cough med, discussed red flags       Note: This dictation was prepared with Dragon dictation along with smaller phrase technology. Any transcriptional errors that result from this process are unintentional.

## 2020-06-15 NOTE — Patient Instructions (Signed)
Person Under Monitoring Name: Kyle Mays  Location: [address] [place] [address]   Infection Prevention Recommendations for Individuals Confirmed to have, or Being Evaluated for, 2019 Novel Coronavirus (COVID-19) Infection Who Receive Care at Home  Individuals who are confirmed to have, or are being evaluated for, COVID-19 should follow the prevention steps below until a healthcare provider or local or state health department says they can return to normal activities.  Stay home except to get medical care You should restrict activities outside your home, except for getting medical care. Do not go to work, school, or public areas, and do not use public transportation or taxis.  Call ahead before visiting your doctor Before your medical appointment, call the healthcare provider and tell them that you have, or are being evaluated for, COVID-19 infection. This will help the healthcare provider's office take steps to keep other people from getting infected. Ask your healthcare provider to call the local or state health department.  Monitor your symptoms Seek prompt medical attention if your illness is worsening (e.g., difficulty breathing). Before going to your medical appointment, call the healthcare provider and tell them that you have, or are being evaluated for, COVID-19 infection. Ask your healthcare provider to call the local or state health department.  Wear a facemask You should wear a facemask that covers your nose and mouth when you are in the same room with other people and when you visit a healthcare provider. People who live with or visit you should also wear a facemask while they are in the same room with you.  Separate yourself from other people in your home As much as possible, you should stay in a different room from other people in your home. Also, you should use a separate bathroom, if available.  Avoid sharing household items You should not  share dishes, drinking glasses, cups, eating utensils, towels, bedding, or other items with other people in your home. After using these items, you should wash them thoroughly with soap and water.  Cover your coughs and sneezes Cover your mouth and nose with a tissue when you cough or sneeze, or you can cough or sneeze into your sleeve. Throw used tissues in a lined trash can, and immediately wash your hands with soap and water for at least 20 seconds or use an alcohol-based hand rub.  Wash your [employer] your hands often and thoroughly with soap and water for at least 20 seconds. You can use an alcohol-based hand sanitizer if soap and water are not available and if your hands are not visibly dirty. Avoid touching your eyes, nose, and mouth with unwashed hands.   Prevention Steps for Caregivers and Household Members of Individuals Confirmed to have, or Being Evaluated for, COVID-19 Infection Being Cared for in the Home  If you live with, or provide care at home for, a person confirmed to have, or being evaluated for, COVID-19 infection please follow these guidelines to prevent infection:  Follow healthcare provider's instructions Make sure that you understand and can help the patient follow any healthcare provider instructions for all care.  Provide for the patient's basic needs You should help the patient with basic needs in the home and provide support for getting groceries, prescriptions, and other personal needs.  Monitor the patient's symptoms If they are getting sicker, call his or her medical provider and tell them that the patient has, or is being evaluated for, COVID-19 infection. This will help the healthcare provider's  office take steps to keep other people from getting infected. Ask the healthcare provider to call the local or state health department.  Limit the number of people who have contact with the patient  If possible, have only one caregiver for the  patient.  Other household members should stay in another home or place of residence. If this is not possible, they should stay  in another room, or be separated from the patient as much as possible. Use a separate bathroom, if available.  Restrict visitors who do not have an essential need to be in the home.  Keep older adults, very young children, and other sick people away from the patient Keep older adults, very young children, and those who have compromised immune systems or chronic health conditions away from the patient. This includes people with chronic heart, lung, or kidney conditions, diabetes, and cancer.  Ensure good ventilation Make sure that shared spaces in the home have good air flow, such as from an air conditioner or an opened window, weather permitting.  Wash your hands often  Wash your hands often and thoroughly with soap and water for at least 20 seconds. You can use an alcohol based hand sanitizer if soap and water are not available and if your hands are not visibly dirty.  Avoid touching your eyes, nose, and mouth with unwashed hands.  Use disposable paper towels to dry your hands. If not available, use dedicated cloth towels and replace them when they become wet.  Wear a facemask and gloves  Wear a disposable facemask at all times in the room and gloves when you touch or have contact with the patient's blood, body fluids, and/or secretions or excretions, such as sweat, saliva, sputum, nasal mucus, vomit, urine, or feces.  Ensure the mask fits over your nose and mouth tightly, and do not touch it during use.  Throw out disposable facemasks and gloves after using them. Do not reuse.  Wash your hands immediately after removing your facemask and gloves.  If your personal clothing becomes contaminated, carefully remove clothing and launder. Wash your hands after handling contaminated clothing.  Place all used disposable facemasks, gloves, and other waste in a lined  container before disposing them with other household waste.  Remove gloves and wash your hands immediately after handling these items.  Do not share dishes, glasses, or other household items with the patient  Avoid sharing household items. You should not share dishes, drinking glasses, cups, eating utensils, towels, bedding, or other items with a patient who is confirmed to have, or being evaluated for, COVID-19 infection.  After the person uses these items, you should wash them thoroughly with soap and water.  Wash laundry thoroughly  Immediately remove and wash clothes or bedding that have blood, body fluids, and/or secretions or excretions, such as sweat, saliva, sputum, nasal mucus, vomit, urine, or feces, on them.  Wear gloves when handling laundry from the patient.  Read and follow directions on labels of laundry or clothing items and detergent. In general, wash and dry with the warmest temperatures recommended on the label.  Clean all areas the individual has used often  Clean all touchable surfaces, such as counters, tabletops, doorknobs, bathroom fixtures, toilets, phones, keyboards, tablets, and bedside tables, every day. Also, clean any surfaces that may have blood, body fluids, and/or secretions or excretions on them.  Wear gloves when cleaning surfaces the patient has come in contact with.  Use a diluted bleach solution (e.g., dilute bleach with 1  part bleach and 10 parts water) or a household disinfectant with a label that says EPA-registered for coronaviruses. To make a bleach solution at home, add 1 tablespoon of bleach to 1 quart (4 cups) of water. For a larger supply, add  cup of bleach to 1 gallon (16 cups) of water.  Read labels of cleaning products and follow recommendations provided on product labels. Labels contain instructions for safe and effective use of the cleaning product including precautions you should take when applying the product, such as wearing gloves or  eye protection and making sure you have good ventilation during use of the product.  Remove gloves and wash hands immediately after cleaning.  Monitor yourself for signs and symptoms of illness Caregivers and household members are considered close contacts, should monitor their health, and will be asked to limit movement outside of the home to the extent possible. Follow the monitoring steps for close contacts listed on the symptom monitoring form.   ? If you have additional questions, contact your local health department or call the epidemiologist on call at [phone] (available 24/7). ? This guidance is subject to change. For the most up-to-date guidance from [facility], please refer to their website: YouBlogs.pl

## 2020-06-16 LAB — SARS-COV-2 RNA,(COVID-19) QUALITATIVE NAAT: SARS CoV2 RNA: DETECTED — CR

## 2020-06-17 ENCOUNTER — Encounter: Payer: Self-pay | Admitting: Family Medicine

## 2020-06-22 DIAGNOSIS — J1282 Pneumonia due to coronavirus disease 2019: Secondary | ICD-10-CM | POA: Diagnosis not present

## 2020-06-22 DIAGNOSIS — R05 Cough: Secondary | ICD-10-CM | POA: Diagnosis not present

## 2020-06-22 DIAGNOSIS — Z6841 Body Mass Index (BMI) 40.0 and over, adult: Secondary | ICD-10-CM | POA: Diagnosis not present

## 2020-06-22 DIAGNOSIS — R110 Nausea: Secondary | ICD-10-CM | POA: Diagnosis not present

## 2020-06-22 DIAGNOSIS — M47814 Spondylosis without myelopathy or radiculopathy, thoracic region: Secondary | ICD-10-CM | POA: Diagnosis not present

## 2020-06-22 DIAGNOSIS — R0902 Hypoxemia: Secondary | ICD-10-CM | POA: Diagnosis not present

## 2020-06-22 DIAGNOSIS — U071 COVID-19: Secondary | ICD-10-CM | POA: Diagnosis not present

## 2020-06-22 DIAGNOSIS — G4730 Sleep apnea, unspecified: Secondary | ICD-10-CM | POA: Diagnosis not present

## 2020-06-22 DIAGNOSIS — R630 Anorexia: Secondary | ICD-10-CM | POA: Diagnosis not present

## 2020-06-23 DIAGNOSIS — Z6841 Body Mass Index (BMI) 40.0 and over, adult: Secondary | ICD-10-CM | POA: Diagnosis not present

## 2020-06-23 DIAGNOSIS — R05 Cough: Secondary | ICD-10-CM | POA: Diagnosis not present

## 2020-06-23 DIAGNOSIS — R0902 Hypoxemia: Secondary | ICD-10-CM | POA: Diagnosis not present

## 2020-06-23 DIAGNOSIS — G4730 Sleep apnea, unspecified: Secondary | ICD-10-CM | POA: Diagnosis not present

## 2020-06-23 DIAGNOSIS — U071 COVID-19: Secondary | ICD-10-CM | POA: Insufficient documentation

## 2020-06-23 DIAGNOSIS — J1282 Pneumonia due to coronavirus disease 2019: Secondary | ICD-10-CM | POA: Diagnosis not present

## 2020-06-24 DIAGNOSIS — R05 Cough: Secondary | ICD-10-CM | POA: Diagnosis not present

## 2020-06-24 DIAGNOSIS — J1282 Pneumonia due to coronavirus disease 2019: Secondary | ICD-10-CM | POA: Diagnosis not present

## 2020-06-24 DIAGNOSIS — Z6841 Body Mass Index (BMI) 40.0 and over, adult: Secondary | ICD-10-CM | POA: Diagnosis not present

## 2020-06-24 DIAGNOSIS — G4730 Sleep apnea, unspecified: Secondary | ICD-10-CM | POA: Diagnosis not present

## 2020-06-24 DIAGNOSIS — U071 COVID-19: Secondary | ICD-10-CM | POA: Diagnosis not present

## 2020-06-24 DIAGNOSIS — R0902 Hypoxemia: Secondary | ICD-10-CM | POA: Diagnosis not present

## 2020-06-25 ENCOUNTER — Telehealth: Payer: Self-pay | Admitting: *Deleted

## 2020-06-25 DIAGNOSIS — U071 COVID-19: Secondary | ICD-10-CM | POA: Diagnosis not present

## 2020-06-25 DIAGNOSIS — R0902 Hypoxemia: Secondary | ICD-10-CM | POA: Diagnosis not present

## 2020-06-25 DIAGNOSIS — J1282 Pneumonia due to coronavirus disease 2019: Secondary | ICD-10-CM | POA: Diagnosis not present

## 2020-06-25 NOTE — Telephone Encounter (Signed)
Received e-mail from patient with FMLA forms.   Reason FMLA requested: 06/15/2020- COVID, 06/22/2020- hospitalization for PNA  Requested Beginning Date: 06/15/2020.  Unable to determine return to work date until patient has been discharged from hospital.

## 2020-06-26 NOTE — Telephone Encounter (Signed)
Call placed to patient to F/U. Spoke with patient wife [place].   Reports that patient has been discharged home from hospital on 06/25/2020.  Hospital F/U scheduled with PCP. Will discuss FMLA at that time.

## 2020-06-26 NOTE — Telephone Encounter (Signed)
noted 

## 2020-06-29 ENCOUNTER — Other Ambulatory Visit: Payer: Self-pay | Admitting: Family Medicine

## 2020-07-01 ENCOUNTER — Ambulatory Visit (INDEPENDENT_AMBULATORY_CARE_PROVIDER_SITE_OTHER): Payer: Self-pay | Admitting: Family Medicine

## 2020-07-01 ENCOUNTER — Other Ambulatory Visit: Payer: Self-pay

## 2020-07-01 ENCOUNTER — Encounter: Payer: Self-pay | Admitting: Family Medicine

## 2020-07-01 VITALS — BP 132/84 | HR 92 | Temp 98.3°F | Resp 16 | Ht 66.0 in | Wt 288.0 lb

## 2020-07-01 DIAGNOSIS — I10 Essential (primary) hypertension: Secondary | ICD-10-CM

## 2020-07-01 DIAGNOSIS — Z8616 Personal history of COVID-19: Secondary | ICD-10-CM

## 2020-07-01 DIAGNOSIS — Z6841 Body Mass Index (BMI) 40.0 and over, adult: Secondary | ICD-10-CM

## 2020-07-01 NOTE — Patient Instructions (Addendum)
F/U as previous Oxygen to be stopped

## 2020-07-01 NOTE — Progress Notes (Signed)
   Subjective:    Patient ID: Kyle Mays, male    DOB: 08-Apr-1981, 39 y.o.   MRN: [id number]  Patient presents for Hospital F/U (COVID- continues to have intermittent HA ) and FMLA (discuss return to work)  Patient here for hospital follow-up.  He was diagnosed COVID-19 on 9/13.  Fortunately he decompensated and had to be admitted to the hospital at [facility].  He was treated with IV steroids along with antibiotics as he had concurrent bacterial pneumonia also received remdesivir.  He is started on Eliquis for DVT prophylaxis.  On discharge he was given prednisone taper which he is completing.  He has completed his azithromycin.  He is using [employer] as needed for cough but does not have any significant cough.  His breathing is much improved since discharge from the hospital.  He is ready to return to work.  He has not required his oxygen which he was discharged on the past 4 days.  He is using his CPAP which he had prior to hospitalization.  He denies any GI upset.  He still gets some mild headaches but they are resolved with Tylenol. Given remidiiver, steroids Last day of steroids today  He finished azithroycmin earlier this week  On oxygen at discharge, has since Sunday  95-96% On regulary   [employer] [facility] [place] [facility]  [phone]  Review Of Systems:  GEN- denies fatigue, fever, weight loss,weakness, recent illness HEENT- denies eye drainage, change in vision, nasal discharge, CVS- denies chest pain, palpitations RESP- denies SOB, cough, wheeze ABD- denies N/V, change in stools, abd pain GU- denies dysuria, hematuria, dribbling, incontinence MSK- denies joint pain, muscle aches, injury Neuro- denies headache, dizziness, syncope, seizure activity       Objective:    BP 132/84   Pulse 92   Temp 98.3 F (36.8 C) (Temporal)   Resp 16   Ht _0  (1.676 m)   Wt 288 lb (130.6 kg)   SpO2 95%   BMI 46.48 kg/m  GEN- NAD, alert and oriented x3,obese HEENT- PERRL,  EOMI, non injected sclera, pink conjunctiva Neck- Supple, no LAD CVS- RRR, no murmur RESP-CTAB ABD-NABS,soft,NT,ND NEURO-CNII-XII in tact no focal deficits EXT- No edema Pulses- Radial2+        Assessment & Plan:      Problem List Items Addressed This Visit      Unprioritized   Essential hypertension    Blood pressure controlled reCHECK CBC/CMET post hospitalizations for COVID-19  He has met criteria to end isolation Improved symptoms, fatigue, HA may linger can take OTC meds Will d/c oxygen as not using, continue CPAP therapy He can return to work  [employer] forms completed         Relevant Medications   apixaban (ELIQUIS) 5 MG TABS tablet   Other Relevant Orders   CBC with Differential/Platelet (Completed)   Comprehensive metabolic panel (Completed)   Obesity    Other Visit Diagnoses    History of 2019 novel coronavirus disease (COVID-19)    -  Primary   Relevant Orders   CBC with Differential/Platelet (Completed)   Comprehensive metabolic panel (Completed)      Note: This dictation was prepared with Dragon dictation along with smaller phrase technology. Any transcriptional errors that result from this process are unintentional.

## 2020-07-02 ENCOUNTER — Encounter: Payer: Self-pay | Admitting: Family Medicine

## 2020-07-02 LAB — CBC WITH DIFFERENTIAL/PLATELET
Absolute Monocytes: 749 cells/uL (ref 200–950)
Basophils Absolute: 59 cells/uL (ref 0–200)
Basophils Relative: 0.5 %
Eosinophils Absolute: 59 cells/uL (ref 15–500)
Eosinophils Relative: 0.5 %
HCT: 45.5 % (ref 38.5–50.0)
Hemoglobin: 15 g/dL (ref 13.2–17.1)
Lymphs Abs: 1392 cells/uL (ref 850–3900)
MCH: 28 pg (ref 27.0–33.0)
MCHC: 33 g/dL (ref 32.0–36.0)
MCV: 85 fL (ref 80.0–100.0)
MPV: 12.8 fL — ABNORMAL HIGH (ref 7.5–12.5)
Monocytes Relative: 6.4 %
Neutro Abs: 9442 cells/uL — ABNORMAL HIGH (ref 1500–7800)
Neutrophils Relative %: 80.7 %
Platelets: 300 10*3/uL (ref 140–400)
RBC: 5.35 10*6/uL (ref 4.20–5.80)
RDW: 13.2 % (ref 11.0–15.0)
Total Lymphocyte: 11.9 %
WBC: 11.7 10*3/uL — ABNORMAL HIGH (ref 3.8–10.8)

## 2020-07-02 LAB — COMPREHENSIVE METABOLIC PANEL
AG Ratio: 1.5 (calc) (ref 1.0–2.5)
ALT: 89 U/L — ABNORMAL HIGH (ref 9–46)
AST: 25 U/L (ref 10–40)
Albumin: 4.2 g/dL (ref 3.6–5.1)
Alkaline phosphatase (APISO): 53 U/L (ref 36–130)
BUN: 14 mg/dL (ref 7–25)
CO2: 24 mmol/L (ref 20–32)
Calcium: 9.1 mg/dL (ref 8.6–10.3)
Chloride: 104 mmol/L (ref 98–110)
Creat: 0.91 mg/dL (ref 0.60–1.35)
Globulin: 2.8 g/dL (calc) (ref 1.9–3.7)
Glucose, Bld: 112 mg/dL — ABNORMAL HIGH (ref 65–99)
Potassium: 4.3 mmol/L (ref 3.5–5.3)
Sodium: 139 mmol/L (ref 135–146)
Total Bilirubin: 0.4 mg/dL (ref 0.2–1.2)
Total Protein: 7 g/dL (ref 6.1–8.1)

## 2020-07-02 NOTE — Assessment & Plan Note (Signed)
Blood pressure controlled reCHECK CBC/CMET post hospitalizations for COVID-19  He has met criteria to end isolation Improved symptoms, fatigue, HA may linger can take OTC meds Will d/c oxygen as not using, continue CPAP therapy He can return to work  [employer] forms completed

## 2020-07-10 ENCOUNTER — Other Ambulatory Visit: Payer: Self-pay | Admitting: *Deleted

## 2020-07-10 DIAGNOSIS — R7989 Other specified abnormal findings of blood chemistry: Secondary | ICD-10-CM

## 2020-07-10 DIAGNOSIS — D72829 Elevated white blood cell count, unspecified: Secondary | ICD-10-CM

## 2020-07-15 ENCOUNTER — Ambulatory Visit: Payer: BC Managed Care – PPO | Admitting: Neurology

## 2020-07-15 ENCOUNTER — Encounter: Payer: Self-pay | Admitting: Neurology

## 2020-07-15 ENCOUNTER — Other Ambulatory Visit: Payer: Self-pay

## 2020-07-15 ENCOUNTER — Encounter: Payer: Self-pay | Admitting: Family Medicine

## 2020-07-15 VITALS — BP 122/78 | HR 87 | Ht 66.0 in | Wt 312.3 lb

## 2020-07-15 DIAGNOSIS — G4733 Obstructive sleep apnea (adult) (pediatric): Secondary | ICD-10-CM | POA: Diagnosis not present

## 2020-07-15 DIAGNOSIS — Z9989 Dependence on other enabling machines and devices: Secondary | ICD-10-CM

## 2020-07-15 DIAGNOSIS — R519 Headache, unspecified: Secondary | ICD-10-CM | POA: Diagnosis not present

## 2020-07-15 DIAGNOSIS — Z8616 Personal history of COVID-19: Secondary | ICD-10-CM

## 2020-07-15 DIAGNOSIS — Z6841 Body Mass Index (BMI) 40.0 and over, adult: Secondary | ICD-10-CM

## 2020-07-15 NOTE — Patient Instructions (Addendum)
Thank you for choosing [facility] for your sleep related care! It was nice to meet you today! I appreciate that you entrust me with your sleep related healthcare concerns. I hope, I was able to address at least some of your concerns today, and that I can help you feel reassured and also get better.    Here is what we discussed today and what we came up with as our plan for you:    I am very glad to hear that you are feeling better after being hospitalized for Covid last month.  You are compliant with your AutoPap and your settings look good, you do need new supplies.  We have a sample mask for you today and 2 filters.  I hope this helps you get by for now.  We will order a home sleep test to reestablish your sleep apnea diagnosis and to get you supplies on an ongoing basis through your insurance.  Please continue using your autoPAP regularly. While your insurance requires that you use PAP at least 4 hours each night on 70% of the nights, I recommend, that you not skip any nights and use it throughout the night if you can.   Please remember, the risks and ramifications of moderate to severe obstructive sleep apnea or OSA are: Cardiovascular disease, including congestive heart failure, stroke, difficult to control hypertension, arrhythmias, and even type 2 diabetes has been linked to untreated OSA. Sleep apnea causes disruption of sleep and sleep deprivation in most cases, which, in turn, can cause recurrent headaches, problems with memory, mood, concentration, focus, and vigilance. Most people with untreated sleep apnea report excessive daytime sleepiness, which can affect their ability to drive.   Please do not drive if you feel sleepy.   Please continue to work on weight loss.  We will arrange for a follow-up after your home sleep test.  Your history is not very suggestive of underlying narcolepsy.  Nevertheless, if the need arises down the road, we can evaluate you with  extended sleep testing for underlying sleepiness despite proper treatment of sleep apnea.  As you indicated, when you are able to use your machine and sleep through the night you feel much better.  Our sleep lab administrative assistant will call you to schedule your sleep study. If you don't hear back from her by about 2 weeks from now, please feel free to call her at [phone]. You can leave a message with your phone number and concerns, if you get the voicemail box. She will call back as soon as possible.
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
## 2020-07-25 DIAGNOSIS — U071 COVID-19: Secondary | ICD-10-CM | POA: Diagnosis not present

## 2020-07-25 DIAGNOSIS — J1282 Pneumonia due to coronavirus disease 2019: Secondary | ICD-10-CM | POA: Diagnosis not present

## 2020-07-25 DIAGNOSIS — R0902 Hypoxemia: Secondary | ICD-10-CM | POA: Diagnosis not present

## 2020-08-03 ENCOUNTER — Other Ambulatory Visit: Payer: Self-pay

## 2020-08-03 ENCOUNTER — Ambulatory Visit (INDEPENDENT_AMBULATORY_CARE_PROVIDER_SITE_OTHER): Payer: BC Managed Care – PPO | Admitting: Neurology

## 2020-08-03 ENCOUNTER — Other Ambulatory Visit: Payer: BC Managed Care – PPO

## 2020-08-03 DIAGNOSIS — D72829 Elevated white blood cell count, unspecified: Secondary | ICD-10-CM

## 2020-08-03 DIAGNOSIS — G4733 Obstructive sleep apnea (adult) (pediatric): Secondary | ICD-10-CM | POA: Diagnosis not present

## 2020-08-03 DIAGNOSIS — Z8616 Personal history of COVID-19: Secondary | ICD-10-CM

## 2020-08-03 DIAGNOSIS — R7989 Other specified abnormal findings of blood chemistry: Secondary | ICD-10-CM | POA: Diagnosis not present

## 2020-08-03 DIAGNOSIS — R519 Headache, unspecified: Secondary | ICD-10-CM

## 2020-08-03 DIAGNOSIS — Z6841 Body Mass Index (BMI) 40.0 and over, adult: Secondary | ICD-10-CM

## 2020-08-04 LAB — COMPLETE METABOLIC PANEL WITH GFR
AG Ratio: 1.4 (calc) (ref 1.0–2.5)
ALT: 31 U/L (ref 9–46)
AST: 17 U/L (ref 10–40)
Albumin: 4.1 g/dL (ref 3.6–5.1)
Alkaline phosphatase (APISO): 53 U/L (ref 36–130)
BUN: 13 mg/dL (ref 7–25)
CO2: 27 mmol/L (ref 20–32)
Calcium: 9.7 mg/dL (ref 8.6–10.3)
Chloride: 103 mmol/L (ref 98–110)
Creat: 0.94 mg/dL (ref 0.60–1.35)
GFR, Est African American: 119 mL/min/{1.73_m2} (ref >=60)
GFR, Est Non African American: 102 mL/min/{1.73_m2} (ref >=60)
Globulin: 3 g/dL (calc) (ref 1.9–3.7)
Glucose, Bld: 91 mg/dL (ref 65–99)
Potassium: 4.5 mmol/L (ref 3.5–5.3)
Sodium: 138 mmol/L (ref 135–146)
Total Bilirubin: 0.4 mg/dL (ref 0.2–1.2)
Total Protein: 7.1 g/dL (ref 6.1–8.1)

## 2020-08-04 LAB — CBC WITH DIFFERENTIAL/PLATELET
Absolute Monocytes: 577 cells/uL (ref 200–950)
Basophils Absolute: 56 cells/uL (ref 0–200)
Basophils Relative: 0.6 %
Eosinophils Absolute: 121 cells/uL (ref 15–500)
Eosinophils Relative: 1.3 %
HCT: 42.9 % (ref 38.5–50.0)
Hemoglobin: 13.3 g/dL (ref 13.2–17.1)
Lymphs Abs: 1962 cells/uL (ref 850–3900)
MCH: 26.9 pg — ABNORMAL LOW (ref 27.0–33.0)
MCHC: 31 g/dL — ABNORMAL LOW (ref 32.0–36.0)
MCV: 86.8 fL (ref 80.0–100.0)
MPV: 12.5 fL (ref 7.5–12.5)
Monocytes Relative: 6.2 %
Neutro Abs: 6584 cells/uL (ref 1500–7800)
Neutrophils Relative %: 70.8 %
Platelets: 269 10*3/uL (ref 140–400)
RBC: 4.94 10*6/uL (ref 4.20–5.80)
RDW: 14.1 % (ref 11.0–15.0)
Total Lymphocyte: 21.1 %
WBC: 9.3 10*3/uL (ref 3.8–10.8)

## 2020-08-10 NOTE — Addendum Note (Signed)
Addended by: Huston Foley on: 08/10/2020 05:20 PM   Modules accepted: Orders

## 2020-08-10 NOTE — Progress Notes (Signed)
Patient referred by Dr. Jeanice Lim, seen by me on 07/15/20, HST on 08/03/20 for re-evaluation and to qualify for ongoing supplies (he bought his autoPAP in 2018).    Please call and notify the patient that the recent home sleep test confirmed obstructive sleep apnea in the severe range. I will write for new supplies for his autoPAP, I recommend ongoing treatment at his current autoPAP setting of 10-20 cm. We should arrange for a FU in sleep clinic for 10 weeks, please arrange that with me or one of our NPs. Thanks,   Huston Foley, MD, [name] [facility])
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
## 2020-08-11 ENCOUNTER — Telehealth: Payer: Self-pay

## 2020-08-11 DIAGNOSIS — G4733 Obstructive sleep apnea (adult) (pediatric): Secondary | ICD-10-CM | POA: Diagnosis not present

## 2020-08-11 NOTE — Telephone Encounter (Signed)
I called pt. I discussed his sleep study results and recommendations with him. He would like to use [place] for supplies. A follow up appt was made for 11/20/19 at 1:00pm with Dr. Frances Furbish. Pt verbalized understanding of results. Pt had no questions at this time but was encouraged to call back if questions arise.

## 2020-08-11 NOTE — Telephone Encounter (Signed)
-----   Message from Huston Foley, MD sent at 08/10/2020  5:20 PM EST ----- Patient referred by Dr. Jeanice Lim, seen by me on 07/15/20, HST on 08/03/20 for re-evaluation and to qualify for ongoing supplies (he bought his autoPAP in 2018).    Please call and notify the patient that the recent home sleep test confirmed obstructive sleep apnea in the severe range. I will write for new supplies for his autoPAP, I recommend ongoing treatment at his current autoPAP setting of 10-20 cm. We should arrange for a FU in sleep clinic for 10 weeks, please arrange that with me or one of our NPs. Thanks,   Huston Foley, MD, [name] [facility])

## 2020-08-13 DIAGNOSIS — R1010 Upper abdominal pain, unspecified: Secondary | ICD-10-CM | POA: Diagnosis not present

## 2020-08-25 DIAGNOSIS — U071 COVID-19: Secondary | ICD-10-CM | POA: Diagnosis not present

## 2020-08-25 DIAGNOSIS — R0902 Hypoxemia: Secondary | ICD-10-CM | POA: Diagnosis not present

## 2020-08-25 DIAGNOSIS — J1282 Pneumonia due to coronavirus disease 2019: Secondary | ICD-10-CM | POA: Diagnosis not present

## 2020-09-06 ENCOUNTER — Other Ambulatory Visit: Payer: Self-pay | Admitting: Family Medicine

## 2020-09-07 NOTE — Telephone Encounter (Signed)
Ok to refill??  Last office visit 07/01/2020.  Last refill 06/15/2020, #2 refills.

## 2020-09-08 ENCOUNTER — Ambulatory Visit: Payer: BC Managed Care – PPO | Admitting: Family Medicine

## 2020-09-08 ENCOUNTER — Other Ambulatory Visit: Payer: Self-pay

## 2020-09-08 VITALS — BP 132/80 | HR 80 | Temp 98.2°F | Resp 17 | Ht 66.0 in

## 2020-09-08 DIAGNOSIS — L918 Other hypertrophic disorders of the skin: Secondary | ICD-10-CM | POA: Diagnosis not present

## 2020-09-08 DIAGNOSIS — Z6841 Body Mass Index (BMI) 40.0 and over, adult: Secondary | ICD-10-CM

## 2020-09-08 DIAGNOSIS — E8881 Metabolic syndrome: Secondary | ICD-10-CM

## 2020-09-08 DIAGNOSIS — I10 Essential (primary) hypertension: Secondary | ICD-10-CM

## 2020-09-08 MED ORDER — OZEMPIC (1 MG/DOSE) 2 MG/1.5ML ~~LOC~~ SOPN: 1.0000 mg | PEN_INJECTOR | SUBCUTANEOUS | 1 refills | Status: DC

## 2020-09-08 NOTE — Progress Notes (Signed)
Subjective:    Patient ID: Kyle Mays, male    DOB: Feb 17, 1981, 39 y.o.   MRN: [id number]  Patient presents for Follow-up (pt following up would like to discuss medication that was meant to have an increasing dose bu never got new dose )  To follow-up medications. He is currently on Ozempic to help with his weight management. He is building a 1.5 mg once a week. States that it helps some but he still hungry. He has been stress eating due to some issues at work. He did not get on the scale today but does feel like he gained weight. He would like to continue with the medication but would like to go to the higher dose. He did have COVID-19 a couple months ago. He states that his cough is anterior now back to baseline. He had elevated liver function tests associated but that has now resolved.   He has 2 skin tags that bother him and get irritated, including he would like to have these removed today.  He does have some issues going on at work but feels like he would be able to work through it. He is taking his fluoxetine and using his alprazolam regularly.  Review Of Systems:  GEN- denies fatigue, fever, weight loss,weakness, recent illness HEENT- denies eye drainage, change in vision, nasal discharge, CVS- denies chest pain, palpitations RESP- denies SOB, cough, wheeze ABD- denies N/V, change in stools, abd pain GU- denies dysuria, hematuria, dribbling, incontinence MSK- denies joint pain, muscle aches, injury Neuro- denies headache, dizziness, syncope, seizure activity       Objective:    BP 132/80   Pulse 80   Temp 98.2 F (36.8 C) (Temporal)   Resp 17   Ht 5\' 6"  (1.676 m)   SpO2 95%   BMI 50.41 kg/m  GEN- NAD, alert and oriented x3 HEENT- PERRL, EOMI, non injected sclera, pink conjunctiva, MMM, oropharynx clear Neck- Supple, no thyromegaly CVS- RRR, no murmur RESP-CTAB ABD-NABS,soft,NT,ND Skin 2+ skin tags larger one on the right side of the neck smaller on the left  nontender no erythema EXT- No edema Pulses- Radial, DP- 2+   Procedure- Skin Tag Removal Procedure explained to patient questions answered benefits and risks discussed verbal consent obtained. Antiseptic-ETOH Anesthesia-Cold Spray  Tags clipped at base with scissors Minimal blood loss, drysol to neck due to persistent oozing Patient tolerated procedure well Triple antibiotic ointment Bandage applied Aftercare discussed        Assessment & Plan:      Problem List Items Addressed This Visit      Unprioritized   Essential hypertension - Primary    Blood pressure is controlled. No change to medication.      Metabolic syndrome    Metabolic syndrome with last 3 obesity comorbidities. Plan increase Ozempic to 1 mg once a week. Discussed getting back on his lower carb diet and removing the snack food junk food. He has remained off of soda.      Obesity    Follow-up in 4 to 6 weeks for weight management.      Relevant Medications   Semaglutide, 1 MG/DOSE, (OZEMPIC, 1 MG/DOSE,) 2 MG/1.5ML SOPN    Other Visit Diagnoses    Skin tag       Benign skin tags removed patient tolerated well aftercare discussed      Note: This dictation was prepared with Dragon dictation along with smaller phrase technology. Any transcriptional errors that result from this process are unintentional.

## 2020-09-08 NOTE — Patient Instructions (Signed)
F/U 5-6 WEEKS

## 2020-09-09 ENCOUNTER — Encounter: Payer: Self-pay | Admitting: Family Medicine

## 2020-09-09 NOTE — Assessment & Plan Note (Signed)
Follow-up in 4 to 6 weeks for weight management.

## 2020-09-09 NOTE — Assessment & Plan Note (Addendum)
Blood pressure is controlled.  No change to medication. 

## 2020-09-09 NOTE — Assessment & Plan Note (Signed)
Metabolic syndrome with last 3 obesity comorbidities. Plan increase Ozempic to 1 mg once a week. Discussed getting back on his lower carb diet and removing the snack food junk food. He has remained off of soda.

## 2020-09-24 ENCOUNTER — Other Ambulatory Visit: Payer: Self-pay | Admitting: Family Medicine

## 2020-09-24 DIAGNOSIS — U071 COVID-19: Secondary | ICD-10-CM | POA: Diagnosis not present

## 2020-09-24 DIAGNOSIS — R0902 Hypoxemia: Secondary | ICD-10-CM | POA: Diagnosis not present

## 2020-09-24 DIAGNOSIS — J1282 Pneumonia due to coronavirus disease 2019: Secondary | ICD-10-CM | POA: Diagnosis not present

## 2020-10-04 ENCOUNTER — Other Ambulatory Visit: Payer: Self-pay | Admitting: Family Medicine

## 2020-10-25 ENCOUNTER — Other Ambulatory Visit: Payer: Self-pay | Admitting: Family Medicine

## 2020-10-25 DIAGNOSIS — J1282 Pneumonia due to coronavirus disease 2019: Secondary | ICD-10-CM | POA: Diagnosis not present

## 2020-10-25 DIAGNOSIS — R0902 Hypoxemia: Secondary | ICD-10-CM | POA: Diagnosis not present

## 2020-10-25 DIAGNOSIS — U071 COVID-19: Secondary | ICD-10-CM | POA: Diagnosis not present

## 2020-11-01 ENCOUNTER — Other Ambulatory Visit: Payer: Self-pay | Admitting: Family Medicine

## 2020-11-01 DIAGNOSIS — I10 Essential (primary) hypertension: Secondary | ICD-10-CM

## 2020-11-02 ENCOUNTER — Other Ambulatory Visit: Payer: Self-pay | Admitting: Family Medicine

## 2020-11-11 ENCOUNTER — Other Ambulatory Visit: Payer: Self-pay | Admitting: Family Medicine

## 2020-11-19 ENCOUNTER — Ambulatory Visit: Payer: Self-pay | Admitting: Neurology

## 2020-11-19 ENCOUNTER — Encounter: Payer: Self-pay | Admitting: Neurology

## 2020-11-19 ENCOUNTER — Telehealth: Payer: Self-pay

## 2020-11-19 NOTE — Telephone Encounter (Signed)
Pt no showed 11/19/20 appt with Dr. Athar 

## 2020-11-25 DIAGNOSIS — U071 COVID-19: Secondary | ICD-10-CM | POA: Diagnosis not present

## 2020-11-25 DIAGNOSIS — J1282 Pneumonia due to coronavirus disease 2019: Secondary | ICD-10-CM | POA: Diagnosis not present

## 2020-11-25 DIAGNOSIS — R0902 Hypoxemia: Secondary | ICD-10-CM | POA: Diagnosis not present

## 2020-11-30 ENCOUNTER — Other Ambulatory Visit: Payer: Self-pay | Admitting: Family Medicine

## 2020-12-01 ENCOUNTER — Encounter: Payer: Self-pay | Admitting: Nurse Practitioner

## 2020-12-01 ENCOUNTER — Other Ambulatory Visit: Payer: Self-pay

## 2020-12-01 ENCOUNTER — Ambulatory Visit: Payer: BC Managed Care – PPO | Admitting: Nurse Practitioner

## 2020-12-01 VITALS — BP 124/78 | HR 96 | Temp 97.9°F | Ht 66.0 in | Wt 313.2 lb

## 2020-12-01 DIAGNOSIS — L0291 Cutaneous abscess, unspecified: Secondary | ICD-10-CM

## 2020-12-01 MED ORDER — SULFAMETHOXAZOLE-TRIMETHOPRIM 800-160 MG PO TABS: 1.0000 | ORAL_TABLET | Freq: Two times a day (BID) | ORAL | 0 refills | Status: DC
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
## 2020-12-01 NOTE — Patient Instructions (Addendum)
F/u here Friday for abscess f/u

## 2020-12-23 DIAGNOSIS — J1282 Pneumonia due to coronavirus disease 2019: Secondary | ICD-10-CM | POA: Diagnosis not present

## 2020-12-23 DIAGNOSIS — R0902 Hypoxemia: Secondary | ICD-10-CM | POA: Diagnosis not present

## 2020-12-23 DIAGNOSIS — U071 COVID-19: Secondary | ICD-10-CM | POA: Diagnosis not present

## 2020-12-27 ENCOUNTER — Other Ambulatory Visit: Payer: Self-pay | Admitting: Family Medicine

## 2020-12-27 DIAGNOSIS — I10 Essential (primary) hypertension: Secondary | ICD-10-CM

## 2021-01-07 ENCOUNTER — Ambulatory Visit: Payer: BC Managed Care – PPO | Admitting: Neurology

## 2021-01-07 ENCOUNTER — Encounter: Payer: Self-pay | Admitting: Neurology

## 2021-01-07 VITALS — BP 134/84 | HR 89 | Ht 66.0 in | Wt 312.0 lb

## 2021-01-07 DIAGNOSIS — Z9989 Dependence on other enabling machines and devices: Secondary | ICD-10-CM | POA: Diagnosis not present

## 2021-01-07 DIAGNOSIS — G4733 Obstructive sleep apnea (adult) (pediatric): Secondary | ICD-10-CM | POA: Diagnosis not present
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
## 2021-01-07 NOTE — Patient Instructions (Signed)
You are fully compliant with your AutoPap, keep up the good work!  Please continue to work on weight loss as losing weight may improve the underlying sleep apnea.  Currently, with your AutoPap machine your apnea/hypopnea score is very good.  We can try to improve the leak by changing the mask, you liked the airtouch from [facility], I will prescribe this through your DME company.  Please continue using your autoPAP regularly. While your insurance requires that you use PAP at least 4 hours each night on 70% of the nights, I recommend, that you not skip any nights and use it throughout the night if you can. Getting used to PAP and staying with the treatment long term does take time and patience and discipline. Untreated obstructive sleep apnea when it is moderate to severe can have an adverse impact on cardiovascular health and raise her risk for heart disease, arrhythmias, hypertension, congestive heart failure, stroke and diabetes. Untreated obstructive sleep apnea causes sleep disruption, nonrestorative sleep, and sleep deprivation. This can have an impact on your day to day functioning and cause daytime sleepiness and impairment of cognitive function, memory loss, mood disturbance, and problems focussing. Using PAP regularly can improve these symptoms.  Since you are doing well and also had a successful DOT physical, please follow-up routinely in this clinic in 1 year to see one of our nurse practitioners.

## 2021-01-13 ENCOUNTER — Other Ambulatory Visit: Payer: Self-pay | Admitting: Family Medicine

## 2021-01-15 DIAGNOSIS — G4733 Obstructive sleep apnea (adult) (pediatric): Secondary | ICD-10-CM | POA: Diagnosis not present

## 2021-01-23 ENCOUNTER — Other Ambulatory Visit: Payer: Self-pay | Admitting: Family Medicine

## 2021-01-23 DIAGNOSIS — I10 Essential (primary) hypertension: Secondary | ICD-10-CM

## 2021-01-26 NOTE — Telephone Encounter (Signed)
Ok to refill??  Last office visit 12/01/2020.  Last refill 11/30/2020, #1 refill.

## 2021-01-27 NOTE — Telephone Encounter (Signed)
Called patient to schedule. Couldn't leave a message; mailbox full.

## 2021-01-27 NOTE — Telephone Encounter (Signed)
Call placed to patient and patient wife Kyle Mays made aware per VM.

## 2021-01-27 NOTE — Telephone Encounter (Signed)
Left message on home number. Requested call back for scheduling.

## 2021-01-28 ENCOUNTER — Encounter: Payer: Self-pay | Admitting: Nurse Practitioner

## 2021-01-28 ENCOUNTER — Other Ambulatory Visit: Payer: Self-pay

## 2021-01-28 ENCOUNTER — Ambulatory Visit: Payer: BC Managed Care – PPO | Admitting: Nurse Practitioner

## 2021-01-28 VITALS — BP 126/84 | HR 90 | Temp 98.0°F | Ht 66.0 in | Wt 302.0 lb

## 2021-01-28 DIAGNOSIS — G4700 Insomnia, unspecified: Secondary | ICD-10-CM | POA: Diagnosis not present

## 2021-01-28 DIAGNOSIS — I10 Essential (primary) hypertension: Secondary | ICD-10-CM

## 2021-01-28 DIAGNOSIS — F411 Generalized anxiety disorder: Secondary | ICD-10-CM | POA: Diagnosis not present

## 2021-01-28 DIAGNOSIS — E559 Vitamin D deficiency, unspecified: Secondary | ICD-10-CM | POA: Diagnosis not present

## 2021-01-28 DIAGNOSIS — Z6841 Body Mass Index (BMI) 40.0 and over, adult: Secondary | ICD-10-CM

## 2021-01-28 DIAGNOSIS — K219 Gastro-esophageal reflux disease without esophagitis: Secondary | ICD-10-CM

## 2021-01-28 MED ORDER — OZEMPIC (1 MG/DOSE) 4 MG/3ML ~~LOC~~ SOPN: 1.0000 mg | PEN_INJECTOR | SUBCUTANEOUS | 0 refills | Status: DC

## 2021-01-28 MED ORDER — FLUOXETINE HCL 20 MG PO CAPS: 20.0000 mg | ORAL_CAPSULE | Freq: Every day | ORAL | 1 refills | Status: DC

## 2021-01-28 MED ORDER — OMEPRAZOLE 20 MG PO CPDR: 20.0000 mg | DELAYED_RELEASE_CAPSULE | Freq: Every day | ORAL | 1 refills | Status: DC | PRN

## 2021-01-28 MED ORDER — AMLODIPINE BESYLATE 5 MG PO TABS: 5.0000 mg | ORAL_TABLET | Freq: Every day | ORAL | 1 refills | Status: DC

## 2021-01-28 NOTE — Assessment & Plan Note (Signed)
Chronic.  Patient takes alprazolam 1 mg 1/2 tablet as needed.  We discussed that this medication can be habit-forming over time.  Discussed other options including Seroquel.  Patient declines Seroquel at this time and would prefer to either completely stop alprazolam or take Benadryl.  I encouraged use of alprazolam very sparingly- plan for most recent refill to last until next appointment in 3 months.
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
## 2021-01-28 NOTE — Assessment & Plan Note (Addendum)
Chronic.  Controlled on fluoxetine 20 mg daily.  We will continue fluoxetine for now and follow-up in 6 months.  CBC, vitamin D, and TSH today.

## 2021-01-28 NOTE — Assessment & Plan Note (Signed)
Chronic.  Blood pressure well controlled today in clinic.  We will continue amlodipine 5 mg daily, refill sent in.  We will check electrolytes, and kidney function with CMP and plan to follow-up in 6 months.

## 2021-01-28 NOTE — Assessment & Plan Note (Signed)
Chronic.  Controlled with daily omeprazole.  Patient knows his triggers and in general, tries to avoid.  We will check blood counts today and continue daily omeprazole for now-refill sent in.  Follow-up in 6 months.

## 2021-01-28 NOTE — Assessment & Plan Note (Signed)
Chronic.  Discussed pharmacologic options for patient at length today as well as lifestyle changes.  Advised against use of stimulant like phentermine due to underlying anxiety/stress.  Patient agreeable to continue on Ozempic and start incorporating daily physical activity-goal is 30 minutes 5 times weekly of aerobic activity.  Can consider referral to medical weight management in the future, although patient declines this at this time.  Will check hemoglobin A1c to screen for prediabetes, vitamin D level, thyroid hormone.  Follow-up in 3 months.

## 2021-01-29 LAB — CBC WITH DIFFERENTIAL/PLATELET
Absolute Monocytes: 529 cells/uL (ref 200–950)
Basophils Absolute: 40 cells/uL (ref 0–200)
Basophils Relative: 0.5 %
Eosinophils Absolute: 158 cells/uL (ref 15–500)
Eosinophils Relative: 2 %
HCT: 44.8 % (ref 38.5–50.0)
Hemoglobin: 14.5 g/dL (ref 13.2–17.1)
Lymphs Abs: 2030 cells/uL (ref 850–3900)
MCH: 28.2 pg (ref 27.0–33.0)
MCHC: 32.4 g/dL (ref 32.0–36.0)
MCV: 87.2 fL (ref 80.0–100.0)
MPV: 12.3 fL (ref 7.5–12.5)
Monocytes Relative: 6.7 %
Neutro Abs: 5143 cells/uL (ref 1500–7800)
Neutrophils Relative %: 65.1 %
Platelets: 224 10*3/uL (ref 140–400)
RBC: 5.14 10*6/uL (ref 4.20–5.80)
RDW: 13.1 % (ref 11.0–15.0)
Total Lymphocyte: 25.7 %
WBC: 7.9 10*3/uL (ref 3.8–10.8)

## 2021-01-29 LAB — COMPLETE METABOLIC PANEL WITH GFR
AG Ratio: 1.5 (calc) (ref 1.0–2.5)
ALT: 27 U/L (ref 9–46)
AST: 16 U/L (ref 10–40)
Albumin: 4.3 g/dL (ref 3.6–5.1)
Alkaline phosphatase (APISO): 70 U/L (ref 36–130)
BUN: 9 mg/dL (ref 7–25)
CO2: 28 mmol/L (ref 20–32)
Calcium: 9.7 mg/dL (ref 8.6–10.3)
Chloride: 101 mmol/L (ref 98–110)
Creat: 1 mg/dL (ref 0.60–1.35)
GFR, Est African American: 110 mL/min/{1.73_m2} (ref >=60)
GFR, Est Non African American: 95 mL/min/{1.73_m2} (ref >=60)
Globulin: 2.9 g/dL (calc) (ref 1.9–3.7)
Glucose, Bld: 72 mg/dL (ref 65–99)
Potassium: 4.6 mmol/L (ref 3.5–5.3)
Sodium: 138 mmol/L (ref 135–146)
Total Bilirubin: 0.4 mg/dL (ref 0.2–1.2)
Total Protein: 7.2 g/dL (ref 6.1–8.1)

## 2021-01-29 LAB — LIPID PANEL
Cholesterol: 206 mg/dL — ABNORMAL HIGH (ref <200)
HDL: 45 mg/dL (ref >=40)
LDL Cholesterol (Calc): 128 mg/dL (calc) — ABNORMAL HIGH
Non-HDL Cholesterol (Calc): 161 mg/dL (calc) — ABNORMAL HIGH (ref <130)
Total CHOL/HDL Ratio: 4.6 (calc) (ref <5.0)
Triglycerides: 189 mg/dL — ABNORMAL HIGH (ref <150)

## 2021-01-29 LAB — HEMOGLOBIN A1C
Hgb A1c MFr Bld: 5.3 % of total Hgb (ref <5.7)
Mean Plasma Glucose: 105 mg/dL
eAG (mmol/L): 5.8 mmol/L

## 2021-01-29 LAB — TSH: TSH: 3.5 mIU/L (ref 0.40–4.50)

## 2021-01-29 LAB — VITAMIN D 25 HYDROXY (VIT D DEFICIENCY, FRACTURES): Vit D, 25-Hydroxy: 26 ng/mL — ABNORMAL LOW (ref 30–100)

## 2021-04-12 ENCOUNTER — Other Ambulatory Visit: Payer: Self-pay | Admitting: Nurse Practitioner

## 2021-04-12 ENCOUNTER — Encounter: Payer: Self-pay | Admitting: Nurse Practitioner

## 2021-04-12 DIAGNOSIS — Z6841 Body Mass Index (BMI) 40.0 and over, adult: Secondary | ICD-10-CM

## 2021-05-26 ENCOUNTER — Other Ambulatory Visit: Payer: Self-pay | Admitting: Family Medicine

## 2021-05-26 NOTE — Telephone Encounter (Signed)
Ok to refill??  Last office visit 04/12/2021.  Last refill 01/27/2021.

## 2021-05-27 ENCOUNTER — Encounter: Payer: Self-pay | Admitting: Nurse Practitioner

## 2021-09-11 ENCOUNTER — Other Ambulatory Visit: Payer: Self-pay | Admitting: Nurse Practitioner

## 2021-09-11 DIAGNOSIS — F411 Generalized anxiety disorder: Secondary | ICD-10-CM

## 2021-10-09 ENCOUNTER — Other Ambulatory Visit: Payer: Self-pay | Admitting: Nurse Practitioner

## 2021-10-09 DIAGNOSIS — I10 Essential (primary) hypertension: Secondary | ICD-10-CM

## 2021-12-09 ENCOUNTER — Other Ambulatory Visit: Payer: Self-pay | Admitting: Nurse Practitioner

## 2021-12-09 DIAGNOSIS — F411 Generalized anxiety disorder: Secondary | ICD-10-CM

## 2021-12-09 DIAGNOSIS — K219 Gastro-esophageal reflux disease without esophagitis: Secondary | ICD-10-CM

## 2022-01-06 ENCOUNTER — Ambulatory Visit: Payer: Self-pay | Admitting: Adult Health

## 2022-01-13 ENCOUNTER — Ambulatory Visit: Payer: BC Managed Care – PPO | Admitting: Adult Health

## 2023-11-23 ENCOUNTER — Emergency Department (HOSPITAL_COMMUNITY): Payer: BC Managed Care – PPO

## 2023-11-23 ENCOUNTER — Encounter (HOSPITAL_COMMUNITY): Payer: Self-pay | Admitting: Emergency Medicine

## 2023-11-23 ENCOUNTER — Other Ambulatory Visit: Payer: Self-pay

## 2023-11-23 ENCOUNTER — Emergency Department (HOSPITAL_COMMUNITY)
Admission: EM | Admit: 2023-11-23 | Discharge: 2023-11-23 | Disposition: A | Discharge status: Home / Self Care | Payer: BC Managed Care – PPO | Attending: Emergency Medicine | Admitting: Emergency Medicine

## 2023-11-23 DIAGNOSIS — E119 Type 2 diabetes mellitus without complications: Secondary | ICD-10-CM | POA: Diagnosis not present

## 2023-11-23 DIAGNOSIS — R911 Solitary pulmonary nodule: Secondary | ICD-10-CM

## 2023-11-23 DIAGNOSIS — R079 Chest pain, unspecified: Secondary | ICD-10-CM

## 2023-11-23 DIAGNOSIS — M549 Dorsalgia, unspecified: Secondary | ICD-10-CM | POA: Diagnosis not present

## 2023-11-23 DIAGNOSIS — R42 Dizziness and giddiness: Secondary | ICD-10-CM | POA: Insufficient documentation

## 2023-11-23 HISTORY — DX: Essential (primary) hypertension: I10

## 2023-11-23 LAB — BASIC METABOLIC PANEL
Anion gap: 11 (ref 5–15)
BUN: 8 mg/dL (ref 6–20)
CO2: 27 mmol/L (ref 22–32)
Calcium: 9.4 mg/dL (ref 8.9–10.3)
Chloride: 103 mmol/L (ref 98–111)
Creatinine, Ser: 0.81 mg/dL (ref 0.61–1.24)
GFR, Estimated: >60 mL/min (ref >60)
Glucose, Bld: 62 mg/dL — ABNORMAL LOW (ref 70–99)
Potassium: 3.8 mmol/L (ref 3.5–5.1)
Sodium: 141 mmol/L (ref 135–145)

## 2023-11-23 LAB — CBC
HCT: 49.4 % (ref 39.0–52.0)
Hemoglobin: 15.7 g/dL (ref 13.0–17.0)
MCH: 27.8 pg (ref 26.0–34.0)
MCHC: 31.8 g/dL (ref 30.0–36.0)
MCV: 87.4 fL (ref 80.0–100.0)
Platelets: 244 10*3/uL (ref 150–400)
RBC: 5.65 MIL/uL (ref 4.22–5.81)
RDW: 13.7 % (ref 11.5–15.5)
WBC: 8.2 10*3/uL (ref 4.0–10.5)
nRBC: 0 % (ref 0.0–0.2)

## 2023-11-23 LAB — TROPONIN I (HIGH SENSITIVITY)
Troponin I (High Sensitivity): <2 ng/L (ref <18)
Troponin I (High Sensitivity): <2 ng/L (ref <18)

## 2023-11-23 MED ORDER — METHOCARBAMOL 500 MG PO TABS: 500.0000 mg | ORAL_TABLET | Freq: Two times a day (BID) | ORAL | 0 refills | Status: AC

## 2023-11-23 MED ORDER — IOHEXOL 350 MG/ML SOLN
75.0000 mL | Freq: Once | INTRAVENOUS | Status: AC | PRN
  Administered 2023-11-23: 75 mL via INTRAVENOUS

## 2023-11-23 MED ORDER — IBUPROFEN 400 MG PO TABS
400.0000 mg | ORAL_TABLET | Freq: Once | ORAL | Status: AC
  Administered 2023-11-23: 400 mg via ORAL
  Filled 2023-11-23: qty 1

## 2023-11-23 MED ORDER — PREDNISONE 10 MG PO TABS: 40.0000 mg | ORAL_TABLET | Freq: Every day | ORAL | 0 refills | Status: AC

## 2023-11-23 NOTE — ED Notes (Signed)
 Patient transported to CT

## 2023-11-23 NOTE — ED Provider Notes (Signed)
[place] EMERGENCY DEPARTMENT AT [facility] Provider Note   CSN: [id number] Arrival date & time: 11/23/23  1303     History  Chief Complaint  Patient presents with   Chest Pain    BERNARDO BRAYMAN is a 43 y.o. male.  Patient is a 43 year old gentleman who presents to the emergency department with a chief complaint of pain to left side of his chest with associated radiation to his left arm which has been ongoing for approximate the past 4 days.  Patient notes that the pain is worse with certain movements and improves with rest.  He notes that the pain is not exertional or positional in nature.  He denies any recent falls or blunt chest wall trauma.  He denies any personal history of cardiac disease but notes he does have a family history of cardiac disease.  He denies any history of hyperlipidemia or diabetes.  He does note that he has had some intermittent dizziness as well.  He notes that the pain is mainly located just medial to the left axilla.  He also notes that he has felt as though he has got some spasms in his left jaw.  He does note that over the past 24 hours he has had some pain to his back but denies any associated numbness or paresthesias to his extremities.  He denies any syncopal events.   Chest Pain      Home Medications Prior to Admission medications   Medication Sig Start Date End Date Taking? Authorizing Provider  Aspirin-Acetaminophen-Caffeine (GOODYS EXTRA STRENGTH PO) Take 1 Package by mouth daily as needed (pain).   Yes [provider]  amLODipine (NORVASC) 5 MG tablet TAKE ONE (1) TABLET BY MOUTH EVERY DAY 10/11/21  Yes Cathlean Marseilles A, NP  FLUoxetine (PROZAC) 40 MG capsule Take 40 mg by mouth daily.   Yes [provider]  omeprazole (PRILOSEC) 20 MG capsule TAKE 1 CAPSULE BY MOUTH DAILY AS NEEDED Patient taking differently: Take 20 mg by mouth daily. 12/09/21  Yes Donita Brooks, MD      Allergies    Bupropion    Review  of Systems   Review of Systems  Cardiovascular:  Positive for chest pain.  All other systems reviewed and are negative.   Physical Exam Updated Vital Signs BP (!) 140/91   Pulse 75   Temp 98.2 F (36.8 C)   Resp 15   Ht 5\' 6"  (1.676 m)   Wt 106.6 kg   SpO2 100%   BMI 37.93 kg/m  Physical Exam Vitals reviewed.  Constitutional:      Appearance: Normal appearance.  HENT:     Head: Normocephalic and atraumatic.     Nose: Nose normal.     Mouth/Throat:     Mouth: Mucous membranes are moist.  Eyes:     Extraocular Movements: Extraocular movements intact.     Conjunctiva/sclera: Conjunctivae normal.     Pupils: Pupils are equal, round, and reactive to light.  Cardiovascular:     Rate and Rhythm: Normal rate and regular rhythm.     Pulses: Normal pulses.     Heart sounds: Normal heart sounds.  Pulmonary:     Effort: Pulmonary effort is normal. No tachypnea or respiratory distress.     Breath sounds: Normal breath sounds. No decreased breath sounds, wheezing, rhonchi or rales.  Chest:     Comments: Tenderness palpation over left anterior chest wall Abdominal:     General: Abdomen is flat.  Bowel sounds are normal.     Palpations: Abdomen is soft.  Musculoskeletal:        General: Normal range of motion.     Cervical back: Normal range of motion and neck supple.     Right lower leg: No edema.     Left lower leg: No edema.  Skin:    General: Skin is warm and dry.     Findings: No rash.  Neurological:     General: No focal deficit present.     Mental Status: He is alert and oriented to person, place, and time. Mental status is at baseline.  Psychiatric:        Mood and Affect: Mood normal.        Behavior: Behavior normal.        Thought Content: Thought content normal.        Judgment: Judgment normal.     ED Results / Procedures / Treatments   Labs (all labs ordered are listed, but only abnormal results are displayed) Labs Reviewed  BASIC METABOLIC PANEL -  Abnormal; Notable for the following components:      Result Value   Glucose, Bld 62 (*)    All other components within normal limits  CBC  TROPONIN I (HIGH SENSITIVITY)  TROPONIN I (HIGH SENSITIVITY)    EKG EKG Interpretation Date/Time:  Thursday November 23 2023 13:45:16 EST Ventricular Rate:  87 PR Interval:  162 QRS Duration:  80 QT Interval:  348 QTC Calculation: 418 R Axis:   44  Text Interpretation: Normal sinus rhythm Possible Inferior infarct , age undetermined Cannot rule out Anterior infarct , age undetermined  no significant change since 2014 Confirmed by Pricilla Loveless [phone]) on 11/23/2023 1:55:49 PM  Radiology CT Angio Chest PE W/Cm &/Or Wo Cm Result Date: 11/23/2023 CLINICAL DATA:  Pulmonary embolism (PE) suspected, high prob EXAM: CT ANGIOGRAPHY CHEST WITH CONTRAST TECHNIQUE: Multidetector CT imaging of the chest was performed using the standard protocol during bolus administration of intravenous contrast. Multiplanar CT image reconstructions and MIPs were obtained to evaluate the vascular anatomy. RADIATION DOSE REDUCTION: This exam was performed according to the departmental dose-optimization program which includes automated exposure control, adjustment of the mA and/or kV according to patient size and/or use of iterative reconstruction technique. CONTRAST:  75mL OMNIPAQUE IOHEXOL 350 MG/ML SOLN COMPARISON:  Chest radiograph dated 11/23/2023 FINDINGS: Cardiovascular: Satisfactory opacification of the pulmonary arteries to the segmental level. No evidence of pulmonary embolism. Normal heart size. No pericardial effusion. Thoracic aorta is normal in caliber. Mediastinum/Nodes: No enlarged mediastinal, hilar, or axillary lymph nodes. Thyroid gland, trachea, and esophagus demonstrate no significant findings. Lungs/Pleura: Minimal biapical paraseptal emphysema. No focal consolidation, pleural effusion, or pneumothorax. 5 mm nodule in the right middle lobe (series 6, image 75).  Upper Abdomen: Postoperative changes related to prior gastric bypass. Musculoskeletal: No chest wall abnormality. No acute osseous abnormality. No suspicious osseous lesion. Mild degenerative changes of the mid to lower thoracic spine. Review of the MIP images confirms the above findings. IMPRESSION: 1. No evidence of pulmonary embolism. 2. No acute intrathoracic findings. 3. 5 mm pulmonary nodule in the right middle lobe. If patient is low risk for malignancy, no routine follow-up imaging is recommended. If patient is high risk for malignancy, a non-contrast chest CT at 12 months is optional.This recommendation follows the consensus statement: Guidelines for Management of Incidental Pulmonary Nodules Detected on CT Images: From the Fleischner Society 2017; Radiology 2017; [phone]. Electronically Signed   By: Criss Rosales  Lateef M.D.   On: 11/23/2023 15:35   DG Chest 2 View Result Date: 11/23/2023 CLINICAL DATA:  Chest pain.  Previous bariatric surgery 1 year ago. EXAM: CHEST - 2 VIEW COMPARISON:  Chest radiographs 10/21/2013 FINDINGS: Cardiac silhouette and mediastinal contours are within normal limits. The lungs are clear. No pleural effusion or pneumothorax. No acute skeletal abnormality. IMPRESSION: No active cardiopulmonary disease. Electronically Signed   By: Neita Garnet M.D.   On: 11/23/2023 14:05    Procedures Procedures    Medications Ordered in ED Medications  iohexol (OMNIPAQUE) 350 MG/ML injection 75 mL (75 mLs Intravenous Contrast Given 11/23/23 1502)  ibuprofen (ADVIL) tablet 400 mg (400 mg Oral Given 11/23/23 1509)    ED Course/ Medical Decision Making/ A&P Clinical Course as of 11/23/23 1648  Thu Nov 23, 2023  1641 Troponin I (High Sensitivity): <2 [CR]    Clinical Course User Index [CR] Lelon Perla, PA-C                                 Medical Decision Making This patient presents to the ED for concern of chest pain differential diagnosis includes ACS, pulmonary  embolus, aortic aneurysm/dissection, pericarditis, myocarditis, endocarditis, pneumonia, pneumothorax, hemothorax    Additional history obtained:  Additional history obtained from medical records External records from outside source obtained and reviewed including none   Lab Tests:  I Ordered, and personally interpreted labs.  The pertinent results include: Negative serial troponins   Imaging Studies ordered:  I ordered imaging studies including CTA of chest I independently visualized and interpreted imaging which showed no pulmonary embolus, no pneumothorax, hemothorax, no aortic aneurysm or dissection, right pulmonary nodule I agree with the radiologist interpretation   Medicines ordered and prescription drug management:  I ordered medication including steroids and muscle relaxer for muscle strain versus radiculopathy Reevaluation of the patient after these medicines showed that the patient improved I have reviewed the patients home medicines and have made adjustments as needed   Problem List / ED Course:  Patient is doing well at this time and is stable for discharge home.  Discussed with patient that workup in the emergency department has been overall unremarkable.  Do not suspect ACS at this time as he is otherwise low risk via his heart score.  He had an EKG with no acute ischemic changes and negative serial troponins.  Do not suspect pericarditis or myocarditis or symptoms are not positional in nature.  CTA of the chest was performed which demonstrated no indication for pulmonary embolus and patient has no signs of aortic aneurysm or dissection.  He had no signs of pneumothorax, hemothorax, pneumonia.  Patient was made aware of the right pulmonary nodule and was provided with a copy of his CT scan report and will follow-up with his primary care doctor.  He is otherwise low risk for malignancy.  Patient's pain is reproducible with palpation over the chest wall and suspect that  this is musculoskeletal pain at this time.  Discussed the importance of close follow-up with his primary care doctor on an outpatient basis as well as strict return precautions for any new or worsening symptoms.  Patient voiced understanding and had no additional questions.   Social Determinants of Health:  None      Amount and/or Complexity of Data Reviewed Labs: ordered. Decision-making details documented in ED Course. Radiology: ordered.  Risk Prescription drug management.  Final Clinical Impression(s) / ED Diagnoses Final diagnoses:  None    Rx / DC Orders ED Discharge Orders     None         Kathlen Mody 11/23/23 1653    Bethann Berkshire, MD 11/24/23 1122

## 2023-11-23 NOTE — Discharge Instructions (Signed)
Please follow-up closely with your primary care doctor on an outpatient basis for continued evaluation.  Your workup in the emergency department was otherwise unremarkable.  You were provided with a copy of your CT scan report demonstrating the nodule in your right lung and please ensure that you follow-up reguarding this.  Return to the emergency department immediately for any new or worsening symptoms.

## 2023-11-23 NOTE — ED Triage Notes (Signed)
Pt reports chest pain on the left side x 4 days. Pt feel like jaw is locked and left arm has a tourniquet on it. Pt reports a tingling pain in the center of his back and intermittent sob and dizziness on exertion.
